# Patient Record
Sex: Male | Born: 1990
Health system: Southern US, Community
[De-identification: ages and names within clinical notes are randomized; demographics above are authoritative.]

## PROBLEM LIST (undated history)

## (undated) HISTORY — PX: HAND SURGERY: SHX662

---
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
## 2010-11-10 ENCOUNTER — Ambulatory Visit (INDEPENDENT_AMBULATORY_CARE_PROVIDER_SITE_OTHER): Payer: Self-pay

## 2010-11-10 ENCOUNTER — Emergency Department (HOSPITAL_COMMUNITY)
Admission: EM | Admit: 2010-11-10 | Discharge: 2010-11-11 | Disposition: A | Discharge status: Home / Self Care | Payer: Self-pay | Attending: Emergency Medicine | Admitting: Emergency Medicine

## 2010-11-10 ENCOUNTER — Inpatient Hospital Stay (INDEPENDENT_AMBULATORY_CARE_PROVIDER_SITE_OTHER)
Admission: RE | Admit: 2010-11-10 | Discharge: 2010-11-10 | Disposition: A | Discharge status: Home / Self Care | Payer: Self-pay | Source: Ambulatory Visit | Attending: Emergency Medicine | Admitting: Emergency Medicine

## 2010-11-10 DIAGNOSIS — R112 Nausea with vomiting, unspecified: Secondary | ICD-10-CM | POA: Insufficient documentation

## 2010-11-10 DIAGNOSIS — R109 Unspecified abdominal pain: Secondary | ICD-10-CM

## 2010-11-10 DIAGNOSIS — M545 Low back pain, unspecified: Secondary | ICD-10-CM | POA: Insufficient documentation

## 2010-11-10 DIAGNOSIS — R197 Diarrhea, unspecified: Secondary | ICD-10-CM | POA: Insufficient documentation

## 2010-11-10 LAB — POCT URINALYSIS DIP (DEVICE)
Glucose, UA: NEGATIVE mg/dL
Nitrite: POSITIVE — AB
Urobilinogen, UA: 1 mg/dL (ref 0.0–1.0)
pH: 6 (ref 5.0–8.0)

## 2010-11-10 LAB — CBC: Platelets: 266 10*3/uL (ref 150–400)

## 2010-11-10 LAB — URINALYSIS, ROUTINE W REFLEX MICROSCOPIC
Ketones, ur: >80 mg/dL — AB
Nitrite: NEGATIVE
Protein, ur: NEGATIVE mg/dL
Urobilinogen, UA: 1 mg/dL (ref 0.0–1.0)

## 2010-11-10 LAB — DIFFERENTIAL
Basophils Absolute: 0 10*3/uL (ref 0.0–0.1)
Basophils Relative: 0 % (ref 0–1)
Eosinophils Absolute: 0.3 10*3/uL (ref 0.0–0.7)
Neutrophils Relative %: 76 % (ref 43–77)

## 2010-11-11 ENCOUNTER — Emergency Department (HOSPITAL_COMMUNITY): Payer: Self-pay

## 2010-11-11 LAB — COMPREHENSIVE METABOLIC PANEL
Albumin: 3.6 g/dL (ref 3.5–5.2)
BUN: 6 mg/dL (ref 6–23)
Chloride: 105 mEq/L (ref 96–112)
Creatinine, Ser: 0.84 mg/dL (ref 0.4–1.5)
Total Bilirubin: 0.9 mg/dL (ref 0.3–1.2)
Total Protein: 6.6 g/dL (ref 6.0–8.3)

## 2010-11-11 MED ORDER — IOHEXOL 300 MG/ML  SOLN
100.0000 mL | Freq: Once | INTRAMUSCULAR | Status: AC | PRN
  Administered 2010-11-11: 100 mL via INTRAVENOUS

## 2011-02-25 ENCOUNTER — Inpatient Hospital Stay (INDEPENDENT_AMBULATORY_CARE_PROVIDER_SITE_OTHER)
Admission: RE | Admit: 2011-02-25 | Discharge: 2011-02-25 | Disposition: A | Discharge status: Home / Self Care | Payer: Self-pay | Source: Ambulatory Visit | Attending: Family Medicine | Admitting: Family Medicine

## 2011-02-25 DIAGNOSIS — S40019A Contusion of unspecified shoulder, initial encounter: Secondary | ICD-10-CM

## 2011-02-25 DIAGNOSIS — M799 Soft tissue disorder, unspecified: Secondary | ICD-10-CM

## 2011-03-02 LAB — DIFFERENTIAL
Eosinophils Relative: 4
Lymphocytes Relative: 43
Lymphs Abs: 4.7

## 2011-03-02 LAB — POCT INFECTIOUS MONO SCREEN: Mono Screen: POSITIVE — AB

## 2011-03-02 LAB — CBC
HCT: 45.3
Platelets: 326
WBC: 10.9

## 2015-09-23 ENCOUNTER — Encounter (HOSPITAL_COMMUNITY): Payer: Self-pay

## 2015-09-23 ENCOUNTER — Ambulatory Visit (HOSPITAL_COMMUNITY)
Admission: EM | Admit: 2015-09-23 | Discharge: 2015-09-23 | Disposition: A | Discharge status: Nursing Home (Includes ICF) | Payer: Self-pay | Attending: Family Medicine | Admitting: Family Medicine

## 2015-09-23 ENCOUNTER — Encounter (HOSPITAL_COMMUNITY): Payer: Self-pay | Admitting: *Deleted

## 2015-09-23 DIAGNOSIS — R1032 Left lower quadrant pain: Secondary | ICD-10-CM | POA: Insufficient documentation

## 2015-09-23 DIAGNOSIS — R110 Nausea: Secondary | ICD-10-CM | POA: Insufficient documentation

## 2015-09-23 DIAGNOSIS — K529 Noninfective gastroenteritis and colitis, unspecified: Secondary | ICD-10-CM

## 2015-09-23 DIAGNOSIS — D72829 Elevated white blood cell count, unspecified: Secondary | ICD-10-CM | POA: Insufficient documentation

## 2015-09-23 DIAGNOSIS — R509 Fever, unspecified: Secondary | ICD-10-CM | POA: Insufficient documentation

## 2015-09-23 DIAGNOSIS — F17200 Nicotine dependence, unspecified, uncomplicated: Secondary | ICD-10-CM | POA: Insufficient documentation

## 2015-09-23 LAB — LIPASE, BLOOD: LIPASE: 25 U/L (ref 11–51)

## 2015-09-23 LAB — CBC
HCT: 44.1 % (ref 39.0–52.0)
HEMOGLOBIN: 16.1 g/dL (ref 13.0–17.0)
MCH: 31.5 pg (ref 26.0–34.0)
MCHC: 36.5 g/dL — ABNORMAL HIGH (ref 30.0–36.0)
MCV: 86.3 fL (ref 78.0–100.0)
Platelets: 307 10*3/uL (ref 150–400)
RBC: 5.11 MIL/uL (ref 4.22–5.81)
RDW: 12.7 % (ref 11.5–15.5)
WBC: 15.3 10*3/uL — ABNORMAL HIGH (ref 4.0–10.5)

## 2015-09-23 LAB — COMPREHENSIVE METABOLIC PANEL
ALT: 22 U/L (ref 17–63)
ANION GAP: 16 — AB (ref 5–15)
AST: 24 U/L (ref 15–41)
Albumin: 4.5 g/dL (ref 3.5–5.0)
Alkaline Phosphatase: 76 U/L (ref 38–126)
BUN: 9 mg/dL (ref 6–20)
CHLORIDE: 106 mmol/L (ref 101–111)
CO2: 21 mmol/L — AB (ref 22–32)
Calcium: 9.7 mg/dL (ref 8.9–10.3)
Creatinine, Ser: 0.91 mg/dL (ref 0.61–1.24)
GFR calc non Af Amer: >60 mL/min (ref >60)
Glucose, Bld: 82 mg/dL (ref 65–99)
Potassium: 3.9 mmol/L (ref 3.5–5.1)
SODIUM: 143 mmol/L (ref 135–145)
Total Bilirubin: 1.2 mg/dL (ref 0.3–1.2)
Total Protein: 8.1 g/dL (ref 6.5–8.1)

## 2015-09-23 MED ORDER — ONDANSETRON 4 MG PO TBDP
4.0000 mg | ORAL_TABLET | Freq: Once | ORAL | Status: AC
  Administered 2015-09-23: 4 mg via ORAL

## 2015-09-23 MED ORDER — ONDANSETRON 4 MG PO TBDP
ORAL_TABLET | ORAL | Status: AC
  Filled 2015-09-23: qty 1

## 2015-09-23 NOTE — ED Notes (Addendum)
Pt here via [facility] from [facility] for abdominal pain and vomiting and diarrhea.  [facility]. Nausea continues but no active vomiting.  States he drank "a lot" yesterday.

## 2015-09-23 NOTE — ED Notes (Signed)
Unable to locate patient when called for re-assessment

## 2015-09-23 NOTE — ED Notes (Signed)
Patient presents with abdominal pain since this morning, sharp pain comes and goes accompanied by vomiting and diarrhea. No acute distress

## 2015-09-23 NOTE — ED Provider Notes (Signed)
CSN: [id number]     Arrival date & time 09/23/15  1445 History   First MD Initiated Contact with Patient 09/23/15 1720     Chief Complaint  Patient presents with  . Abdominal Pain   (Consider location/radiation/quality/duration/timing/severity/associated sxs/prior Treatment) Patient is a 25 y.o. male presenting with abdominal pain. The history is provided by the patient.  Abdominal Pain Pain location:  Epigastric Pain quality: cramping, sharp and stabbing   Pain quality comment:  Currently level 3 , at worse is a 9. Pain radiates to:  LUQ Pain severity:  Moderate Onset quality:  Sudden Duration:  12 hours Chronicity:  New Context: alcohol use   Relieved by:  None tried Worsened by:  Nothing tried Ineffective treatments:  None tried Associated symptoms: diarrhea, hematochezia, nausea and vomiting   Associated symptoms: no dysuria, no fever and no shortness of breath   Risk factors: alcohol abuse     History reviewed. No pertinent past medical history. History reviewed. No pertinent past surgical history. No family history on file. Social History  Substance Use Topics  . Smoking status: Current Every Day Smoker  . Smokeless tobacco: Never Used  . Alcohol Use: 4.2 oz/week    7 Shots of liquor per week    Review of Systems  Constitutional: Negative for fever.  HENT: Negative.   Respiratory: Negative for shortness of breath and wheezing.   Cardiovascular: Negative.   Gastrointestinal: Positive for nausea, vomiting, abdominal pain, diarrhea and hematochezia. Negative for blood in stool.  Genitourinary: Negative for dysuria.  All other systems reviewed and are negative.   Allergies  Review of patient's allergies indicates no known allergies.  Home Medications   Prior to Admission medications   Not on File   Meds Ordered and Administered this Visit   Medications  ondansetron (ZOFRAN-ODT) disintegrating tablet 4 mg (4 mg Oral Given 09/23/15 1742)    BP 145/87 mmHg   Pulse 82  Temp(Src) 99.4 F (37.4 C) (Oral)  Resp 16  SpO2 98% No data found.   Physical Exam  Constitutional: He is oriented to person, place, and time. He appears well-developed and well-nourished. He appears distressed.  HENT:  Mouth/Throat: Oropharynx is clear and moist.  Neck: Normal range of motion. Neck supple.  Cardiovascular: Normal heart sounds and intact distal pulses.   Pulmonary/Chest: Effort normal and breath sounds normal.  Abdominal: Soft. He exhibits no distension and no mass. Bowel sounds are decreased. There is tenderness in the epigastric area. There is no rigidity, no rebound, no guarding, no CVA tenderness, no tenderness at McBurney's point and negative Murphy's sign.  Lymphadenopathy:    He has no cervical adenopathy.  Neurological: He is alert and oriented to person, place, and time.  Skin: Skin is warm and dry.  Nursing note and vitals reviewed.   ED Course  Procedures (including critical care time)  Labs Review Labs Reviewed - No data to display  Imaging Review No results found.   Visual Acuity Review  Right Eye Distance:   Left Eye Distance:   Bilateral Distance:    Right Eye Near:   Left Eye Near:    Bilateral Near:         MDM   1. Acute gastroenteritis    Pt with h/o gastritis currently with n/v/d onset today, unable to tolerate po., possible related to etoh xs. ivf started and po zofran.   Linna HoffJames D Roselinda Bahena, MD 09/23/15 [phone]

## 2015-09-24 ENCOUNTER — Emergency Department (HOSPITAL_COMMUNITY)
Admission: EM | Admit: 2015-09-24 | Discharge: 2015-09-24 | Disposition: A | Discharge status: Home / Self Care | Payer: Self-pay | Attending: Emergency Medicine | Admitting: Emergency Medicine

## 2015-09-24 ENCOUNTER — Encounter (HOSPITAL_COMMUNITY): Payer: Self-pay | Admitting: Radiology

## 2015-09-24 ENCOUNTER — Emergency Department (HOSPITAL_COMMUNITY): Payer: Self-pay

## 2015-09-24 DIAGNOSIS — R109 Unspecified abdominal pain: Secondary | ICD-10-CM

## 2015-09-24 LAB — URINALYSIS, ROUTINE W REFLEX MICROSCOPIC
Glucose, UA: NEGATIVE mg/dL
Hgb urine dipstick: NEGATIVE
Ketones, ur: >80 mg/dL — AB
LEUKOCYTES UA: NEGATIVE
NITRITE: NEGATIVE
PROTEIN: NEGATIVE mg/dL
SPECIFIC GRAVITY, URINE: 1.015 (ref 1.005–1.030)
pH: 6 (ref 5.0–8.0)

## 2015-09-24 MED ORDER — IOPAMIDOL (ISOVUE-300) INJECTION 61%
INTRAVENOUS | Status: AC
  Administered 2015-09-24: 100 mL
  Filled 2015-09-24: qty 100

## 2015-09-24 MED ORDER — OXYCODONE-ACETAMINOPHEN 5-325 MG PO TABS
1.0000 | ORAL_TABLET | Freq: Once | ORAL | Status: AC
  Administered 2015-09-24: 1 via ORAL
  Filled 2015-09-24: qty 1

## 2015-09-24 MED ORDER — ONDANSETRON HCL 4 MG/2ML IJ SOLN
4.0000 mg | Freq: Once | INTRAMUSCULAR | Status: AC
  Administered 2015-09-24: 4 mg via INTRAVENOUS
  Filled 2015-09-24: qty 2

## 2015-09-24 MED ORDER — PROMETHAZINE HCL 25 MG RE SUPP: 25.0000 mg | Freq: Four times a day (QID) | RECTAL | Status: DC | PRN

## 2015-09-24 MED ORDER — IBUPROFEN 600 MG PO TABS: 600.0000 mg | ORAL_TABLET | Freq: Three times a day (TID) | ORAL | Status: DC | PRN

## 2015-09-24 MED ORDER — SODIUM CHLORIDE 0.9 % IV SOLN
1000.0000 mL | INTRAVENOUS | Status: DC
  Administered 2015-09-24: 1000 mL via INTRAVENOUS

## 2015-09-24 MED ORDER — SODIUM CHLORIDE 0.9 % IV SOLN
1000.0000 mL | Freq: Once | INTRAVENOUS | Status: AC
  Administered 2015-09-24: 1000 mL via INTRAVENOUS

## 2015-09-24 MED ORDER — HYDROMORPHONE HCL 1 MG/ML IJ SOLN
1.0000 mg | Freq: Once | INTRAMUSCULAR | Status: AC
  Administered 2015-09-24: 1 mg via INTRAVENOUS
  Filled 2015-09-24: qty 1

## 2015-09-24 NOTE — Discharge Instructions (Signed)

## 2015-09-24 NOTE — ED Provider Notes (Signed)
CSN: [id number]     Arrival date & time 09/23/15  1837 History  By signing my name below, I, David Gould, attest that this documentation has been prepared under the direction and in the presence of Jola Schmidt, MD. Electronically Signed: Helane Gould, ED Scribe. 09/24/2015. 2:34 AM.    Chief Complaint  Patient presents with  . Abdominal Pain   HPI HPI Comments: David Gould is a 25 y.o. male brought in by ambulance who presents to the Emergency Department complaining of worsening, waxing and waning, bilateral lower abdominal pain of sudden onset this morning. Pt states the pain began in his LLQ and has gradually worsened, radiating to the RLQ during the course of the day. He reports associated nausea with each increase in pain, as well as subjective fever, and chills. He denies any other medical issues. Pt denies any urinary symptoms, including dysuria and difficulty urinating.   History reviewed. No pertinent past medical history. History reviewed. No pertinent past surgical history. No family history on file. Social History  Substance Use Topics  . Smoking status: Current Every Day Smoker  . Smokeless tobacco: Never Used  . Alcohol Use: 4.2 oz/week    7 Shots of liquor per week    Review of Systems A complete 10 system review of systems was obtained and all systems are negative except as noted in the HPI and PMH.   Allergies  Review of patient's allergies indicates no known allergies.  Home Medications   Prior to Admission medications   Not on File   BP 145/88 mmHg  Pulse 95  Temp(Src) 99.4 F (37.4 C) (Oral)  Resp 18  Ht 5' 9"  (1.753 m)  Wt 135 lb (61.236 kg)  BMI 19.93 kg/m2  SpO2 99% Physical Exam  Constitutional: He is oriented to person, place, and time. He appears well-developed and well-nourished.  HENT:  Head: Normocephalic.  Eyes: EOM are normal.  Neck: Normal range of motion.  Pulmonary/Chest: Effort normal.  Abdominal: He exhibits no distension.  There is tenderness (mild LLQ).  Musculoskeletal: Normal range of motion.  Neurological: He is alert and oriented to person, place, and time.  Psychiatric: He has a normal mood and affect.  Nursing note and vitals reviewed.   ED Course  Procedures  DIAGNOSTIC STUDIES:  COORDINATION OF CARE: 12:23 AM - Discussed plans to order diagnostic studies and imaging. Pt advised of plan for treatment and pt agrees.  Labs Review Labs Reviewed  COMPREHENSIVE METABOLIC PANEL - Abnormal; Notable for the following:    CO2 21 (*)    Anion gap 16 (*)    All other components within normal limits  CBC - Abnormal; Notable for the following:    WBC 15.3 (*)    MCHC 36.5 (*)    All other components within normal limits  LIPASE, BLOOD  URINALYSIS, ROUTINE W REFLEX MICROSCOPIC (NOT AT [facility])    Imaging Review Ct Abdomen Pelvis W Contrast  09/24/2015  CLINICAL DATA:  Abdominal pain since this morning with vomiting and diarrhea. EXAM: CT ABDOMEN AND PELVIS WITH CONTRAST TECHNIQUE: Multidetector CT imaging of the abdomen and pelvis was performed using the standard protocol following bolus administration of intravenous contrast. CONTRAST:  163m ISOVUE-300 IOPAMIDOL (ISOVUE-300) INJECTION 61% COMPARISON:  11/11/2010 FINDINGS: Lung bases are clear. Mild diffuse fatty infiltration of the liver. No focal liver lesions. The gallbladder, pancreas, spleen, adrenal glands, kidneys, abdominal aorta, inferior vena cava, and retroperitoneal lymph nodes are unremarkable. Stomach, small bowel, and colon are not abnormally  distended. No free air or free fluid in the abdomen. Pelvis: Prostate gland is not enlarged. Bladder wall is not thickened. No free or loculated pelvic fluid collections. No pelvic mass or lymphadenopathy. Appendix is normal. No destructive bone lesions. IMPRESSION: No acute process demonstrated in the abdomen or pelvis. Mild diffuse fatty infiltration of the liver. Electronically Signed   By: Lucienne Capers  M.D.   On: 09/24/2015 01:54   I have personally reviewed and evaluated these images and lab results as part of my medical decision-making.   EKG Interpretation None      MDM   Final diagnoses:  Abdominal pain, unspecified abdominal location   possibly developing a viral infection.  Oral temperature 99.4.  Mild elevated white blood cell count.  CT scan of his abdomen and pelvis without abnormality.  Discharge home in good condition.  Home with nausea medication and anti-inflammatories.  He understands to return to the ER for new or worsening symptoms.  Repeat abdominal exam benign.  I personally performed the services described in this documentation, which was scribed in my presence. The recorded information has been reviewed and is accurate.       Jola Schmidt, MD 09/24/15 [phone]

## 2015-11-05 ENCOUNTER — Encounter (HOSPITAL_COMMUNITY): Payer: Self-pay | Admitting: Emergency Medicine

## 2015-11-05 DIAGNOSIS — J020 Streptococcal pharyngitis: Secondary | ICD-10-CM | POA: Insufficient documentation

## 2015-11-05 DIAGNOSIS — F17200 Nicotine dependence, unspecified, uncomplicated: Secondary | ICD-10-CM | POA: Insufficient documentation

## 2015-11-05 LAB — URINALYSIS, ROUTINE W REFLEX MICROSCOPIC
Bilirubin Urine: NEGATIVE
Glucose, UA: NEGATIVE mg/dL
HGB URINE DIPSTICK: NEGATIVE
Ketones, ur: NEGATIVE mg/dL
LEUKOCYTES UA: NEGATIVE
NITRITE: NEGATIVE
PROTEIN: NEGATIVE mg/dL
Specific Gravity, Urine: 1.018 (ref 1.005–1.030)
pH: 7 (ref 5.0–8.0)

## 2015-11-05 LAB — CBC WITH DIFFERENTIAL/PLATELET
Basophils Absolute: 0 10*3/uL (ref 0.0–0.1)
Basophils Relative: 0 %
EOS ABS: 0.1 10*3/uL (ref 0.0–0.7)
EOS PCT: 0 %
HCT: 45 % (ref 39.0–52.0)
Hemoglobin: 16.2 g/dL (ref 13.0–17.0)
LYMPHS ABS: 1.7 10*3/uL (ref 0.7–4.0)
Lymphocytes Relative: 11 %
MCH: 31.5 pg (ref 26.0–34.0)
MCHC: 36 g/dL (ref 30.0–36.0)
MCV: 87.5 fL (ref 78.0–100.0)
MONO ABS: 1.6 10*3/uL — AB (ref 0.1–1.0)
Monocytes Relative: 10 %
Neutro Abs: 11.8 10*3/uL — ABNORMAL HIGH (ref 1.7–7.7)
Neutrophils Relative %: 79 %
PLATELETS: 235 10*3/uL (ref 150–400)
RBC: 5.14 MIL/uL (ref 4.22–5.81)
RDW: 13.4 % (ref 11.5–15.5)
WBC: 15.1 10*3/uL — AB (ref 4.0–10.5)

## 2015-11-05 MED ORDER — ACETAMINOPHEN 325 MG PO TABS
650.0000 mg | ORAL_TABLET | Freq: Once | ORAL | Status: AC
  Administered 2015-11-05: 650 mg via ORAL

## 2015-11-05 MED ORDER — ACETAMINOPHEN 325 MG PO TABS
ORAL_TABLET | ORAL | Status: AC
  Filled 2015-11-05: qty 2

## 2015-11-05 NOTE — ED Notes (Signed)
Pt c/o generalized body aches, sore throat and elevated temp. Onset this am.  Also c/o nausea.

## 2015-11-06 ENCOUNTER — Emergency Department (HOSPITAL_COMMUNITY)
Admission: EM | Admit: 2015-11-06 | Discharge: 2015-11-06 | Disposition: A | Discharge status: Home / Self Care | Payer: Self-pay | Attending: Emergency Medicine | Admitting: Emergency Medicine

## 2015-11-06 DIAGNOSIS — J020 Streptococcal pharyngitis: Secondary | ICD-10-CM

## 2015-11-06 LAB — COMPREHENSIVE METABOLIC PANEL
ALBUMIN: 4 g/dL (ref 3.5–5.0)
ALT: 20 U/L (ref 17–63)
ANION GAP: 7 (ref 5–15)
AST: 21 U/L (ref 15–41)
Alkaline Phosphatase: 67 U/L (ref 38–126)
CHLORIDE: 106 mmol/L (ref 101–111)
CO2: 27 mmol/L (ref 22–32)
Calcium: 9.5 mg/dL (ref 8.9–10.3)
Creatinine, Ser: 0.95 mg/dL (ref 0.61–1.24)
GFR calc Af Amer: >60 mL/min (ref >60)
GLUCOSE: 87 mg/dL (ref 65–99)
POTASSIUM: 4 mmol/L (ref 3.5–5.1)
Sodium: 140 mmol/L (ref 135–145)
Total Bilirubin: 0.8 mg/dL (ref 0.3–1.2)
Total Protein: 7.6 g/dL (ref 6.5–8.1)

## 2015-11-06 LAB — RAPID STREP SCREEN (MED CTR MEBANE ONLY): STREPTOCOCCUS, GROUP A SCREEN (DIRECT): POSITIVE — AB

## 2015-11-06 MED ORDER — ONDANSETRON 4 MG PO TBDP
8.0000 mg | ORAL_TABLET | Freq: Once | ORAL | Status: AC
  Administered 2015-11-06: 8 mg via ORAL
  Filled 2015-11-06: qty 2

## 2015-11-06 MED ORDER — HYDROCODONE-ACETAMINOPHEN 7.5-325 MG/15ML PO SOLN: 15.0000 mL | Freq: Three times a day (TID) | ORAL | Status: DC | PRN

## 2015-11-06 MED ORDER — HYDROCODONE-ACETAMINOPHEN 7.5-325 MG/15ML PO SOLN
10.0000 mL | Freq: Once | ORAL | Status: AC
  Administered 2015-11-06: 10 mL via ORAL
  Filled 2015-11-06: qty 15

## 2015-11-06 MED ORDER — PENICILLIN G BENZATHINE 1200000 UNIT/2ML IM SUSP
1.2000 10*6.[IU] | Freq: Once | INTRAMUSCULAR | Status: AC
  Administered 2015-11-06: 1.2 10*6.[IU] via INTRAMUSCULAR
  Filled 2015-11-06: qty 2

## 2015-11-06 MED ORDER — IBUPROFEN 100 MG/5ML PO SUSP: 600.0000 mg | Freq: Four times a day (QID) | ORAL | Status: DC | PRN

## 2015-11-06 MED ORDER — KETOROLAC TROMETHAMINE 60 MG/2ML IM SOLN
60.0000 mg | Freq: Once | INTRAMUSCULAR | Status: AC
  Administered 2015-11-06: 60 mg via INTRAMUSCULAR
  Filled 2015-11-06: qty 2

## 2015-11-06 MED ORDER — DEXAMETHASONE SODIUM PHOSPHATE 10 MG/ML IJ SOLN
10.0000 mg | Freq: Once | INTRAMUSCULAR | Status: AC
  Administered 2015-11-06: 10 mg via INTRAMUSCULAR
  Filled 2015-11-06: qty 1

## 2015-11-06 NOTE — ED Notes (Signed)
Pt given cup of water per fluid challenge. Will re-assess shortly.

## 2015-11-06 NOTE — ED Provider Notes (Signed)
CSN: [id number]     Arrival date & time 11/05/15  2229 History   First MD Initiated Contact with Patient 11/06/15 0056     Chief Complaint  Patient presents with  . Generalized Body Aches     (Consider location/radiation/quality/duration/timing/severity/associated sxs/prior Treatment) HPI Comments: [age] year old male with no significant past medical history presents to the emergency department for evaluation of sore throat which began yesterday morning. Symptoms have been constant and worsening since onset. He denies taking any medications prior to arrival. He was able to tolerate Tylenol given in triage. Patient notes associated generalized body aches as well as nausea and nasal congestion. He has had a mild, dry cough. He denies sick contacts and states that he has no children. He believes that he has a history of mononucleosis during high school. Patient has had no inability to swallow or drooling, vomiting, or diarrhea. Patient is a daily smoker.  The history is provided by the patient and a parent. No language interpreter was used.    History reviewed. No pertinent past medical history. History reviewed. No pertinent past surgical history. History reviewed. No pertinent family history. Social History  Substance Use Topics  . Smoking status: Current Every Day Smoker  . Smokeless tobacco: Never Used  . Alcohol Use: 4.2 oz/week    7 Shots of liquor per week    Review of Systems  Constitutional: Positive for fever.  HENT: Positive for congestion and sore throat. Negative for drooling.   Respiratory: Negative for choking.   Gastrointestinal: Positive for nausea.  Musculoskeletal: Positive for myalgias (generalized body aches).  All other systems reviewed and are negative.   Allergies  Review of patient's allergies indicates no known allergies.  Home Medications   Prior to Admission medications   Medication Sig Start Date End Date Taking? Authorizing Provider   HYDROcodone-acetaminophen (HYCET) 7.5-325 mg/15 ml solution Take 15 mLs by mouth every 8 (eight) hours as needed for moderate pain or severe pain. 11/06/15   Antony MaduraKelly Dorrien Grunder, PA-C  ibuprofen (CHILDRENS IBUPROFEN) 100 MG/5ML suspension Take 30 mLs (600 mg total) by mouth every 6 (six) hours as needed for fever, mild pain or moderate pain. 11/06/15   Antony MaduraKelly Dunbar Buras, PA-C  promethazine (PHENERGAN) 25 MG suppository Place 1 suppository (25 mg total) rectally every 6 (six) hours as needed for nausea or vomiting. 09/24/15   Azalia BilisKevin Campos, MD   BP 137/88 mmHg  Pulse 96  Temp(Src) 98.3 F (36.8 C) (Oral)  Resp 18  Ht 5\' 10"  (1.778 m)  Wt 63.504 kg  BMI 20.09 kg/m2  SpO2 98%   Physical Exam  Constitutional: He is oriented to person, place, and time. He appears well-developed and well-nourished. No distress.  Nontoxic appearing  HENT:  Head: Normocephalic and atraumatic.  Right Ear: External ear normal.  Left Ear: External ear normal.  Mouth/Throat: Uvula is midline and mucous membranes are normal. Posterior oropharyngeal erythema present. No oropharyngeal exudate.  Patient tolerating secretions without difficulty. Note trismus. No tripoding. Uvula midline. No posterior oropharyngeal exudates noted.  Eyes: Conjunctivae and EOM are normal. No scleral icterus.  Neck: Normal range of motion.  No nuchal rigidity or meningismus  Cardiovascular: Normal rate, regular rhythm and intact distal pulses.   Pulmonary/Chest: Effort normal and breath sounds normal. No respiratory distress. He has no wheezes. He has no rales.  Respirations even and unlabored  Musculoskeletal: Normal range of motion.  Neurological: He is alert and oriented to person, place, and time.  Skin: Skin is warm and dry.  No rash noted. He is not diaphoretic. No erythema. No pallor.  Psychiatric: He has a normal mood and affect. His behavior is normal.  Nursing note and vitals reviewed.   ED Course  Procedures (including critical care  time) Labs Review Labs Reviewed  RAPID STREP SCREEN (NOT AT [facility]) - Abnormal; Notable for the following:    Streptococcus, Group A Screen (Direct) POSITIVE (*)    All other components within normal limits  CBC WITH DIFFERENTIAL/PLATELET - Abnormal; Notable for the following:    WBC 15.1 (*)    Neutro Abs 11.8 (*)    Monocytes Absolute 1.6 (*)    All other components within normal limits  COMPREHENSIVE METABOLIC PANEL - Abnormal; Notable for the following:    BUN <5 (*)    All other components within normal limits  URINALYSIS, ROUTINE W REFLEX MICROSCOPIC (NOT AT [facility])    Imaging Review No results found.   I have personally reviewed and evaluated these images and lab results as part of my medical decision-making.   EKG Interpretation None       Medications  penicillin g benzathine (BICILLIN LA) 1200000 UNIT/2ML injection 1.2 Million Units (not administered)  acetaminophen (TYLENOL) tablet 650 mg (650 mg Oral Given 11/05/15 2323)  dexamethasone (DECADRON) injection 10 mg (10 mg Intramuscular Given 11/06/15 0134)  ketorolac (TORADOL) injection 60 mg (60 mg Intramuscular Given 11/06/15 0134)  HYDROcodone-acetaminophen (HYCET) 7.5-325 mg/15 ml solution 10 mL (10 mLs Oral Given 11/06/15 0131)  ondansetron (ZOFRAN-ODT) disintegrating tablet 8 mg (8 mg Oral Given 11/06/15 0133)    MDM   Final diagnoses:  Strep throat    Pt presents for febrile with cervical lymphadenopathy and dysphagia x 1 day; diagnosis of strep throat. Treated in the ED with steroids, NSAIDs, pain medication and PCN IM. Presentation not concerning for PTA or infxn spread to soft tissue. No trismus or uvula deviation. Specific return precautions discussed. Patient tolerating secretions and oral fluids without difficulty or drooling. Recommended PCP follow up. Return precautions discussed and provided. Patient discharged in good condition with no unaddressed concerns.      Antony Madura, PA-C 11/06/15  1914  Dione Booze, MD 11/06/15 [phone]

## 2015-11-06 NOTE — Discharge Instructions (Signed)
Take ibuprofen as prescribed for fever and body aches. Take Hycet for throat pain. Do not take Tylenol or acetaminophen if taking Hycet. You can gargle salt water 3-4 times per day. Drink plenty of fluids to prevent dehydration. Return if symptoms worsen.  Strep Throat Strep throat is a bacterial infection of the throat. Your health care provider may call the infection tonsillitis or pharyngitis, depending on whether there is swelling in the tonsils or at the back of the throat. Strep throat is most common during the cold months of the year in children who are 10-19 years of age, but it can happen during any season in people of any age. This infection is spread from person to person (contagious) through coughing, sneezing, or close contact. CAUSES Strep throat is caused by the bacteria called Streptococcus pyogenes. RISK FACTORS This condition is more likely to develop in:  People who spend time in crowded places where the infection can spread easily.  People who have close contact with someone who has strep throat. SYMPTOMS Symptoms of this condition include:  Fever or chills.   Redness, swelling, or pain in the tonsils or throat.  Pain or difficulty when swallowing.  White or yellow spots on the tonsils or throat.  Swollen, tender glands in the neck or under the jaw.  Red rash all over the body (rare). DIAGNOSIS This condition is diagnosed by performing a rapid strep test or by taking a swab of your throat (throat culture test). Results from a rapid strep test are usually ready in a few minutes, but throat culture test results are available after one or two days. TREATMENT This condition is treated with antibiotic medicine. HOME CARE INSTRUCTIONS Medicines  Take over-the-counter and prescription medicines only as told by your health care provider.  Take your antibiotic as told by your health care provider. Do not stop taking the antibiotic even if you start to feel  better.  Have family members who also have a sore throat or fever tested for strep throat. They may need antibiotics if they have the strep infection. Eating and Drinking  Do not share food, drinking cups, or personal items that could cause the infection to spread to other people.  If swallowing is difficult, try eating soft foods until your sore throat feels better.  Drink enough fluid to keep your urine clear or pale yellow. General Instructions  Gargle with a salt-water mixture 3-4 times per day or as needed. To make a salt-water mixture, completely dissolve -1 tsp of salt in 1 cup of warm water.  Make sure that all household members wash their hands well.  Get plenty of rest.  Stay home from school or work until you have been taking antibiotics for 24 hours.  Keep all follow-up visits as told by your health care provider. This is important. SEEK MEDICAL CARE IF:  The glands in your neck continue to get bigger.  You develop a rash, cough, or earache.  You cough up a thick liquid that is green, yellow-brown, or bloody.  You have pain or discomfort that does not get better with medicine.  Your problems seem to be getting worse rather than better.  You have a fever. SEEK IMMEDIATE MEDICAL CARE IF:  You have new symptoms, such as vomiting, severe headache, stiff or painful neck, chest pain, or shortness of breath.  You have severe throat pain, drooling, or changes in your voice.  You have swelling of the neck, or the skin on the neck becomes  red and tender.  You have signs of dehydration, such as fatigue, dry mouth, and decreased urination.  You become increasingly sleepy, or you cannot wake up completely.  Your joints become red or painful.   This information is not intended to replace advice given to you by your health care provider. Make sure you discuss any questions you have with your health care provider.   Document Released: 05/22/2000 Document Revised:  02/13/2015 Document Reviewed: 09/17/2014 Elsevier Interactive Patient Education [employer]2016 [employer] Inc[employer]

## 2016-01-26 ENCOUNTER — Emergency Department (HOSPITAL_COMMUNITY)
Admission: EM | Admit: 2016-01-26 | Discharge: 2016-01-26 | Disposition: A | Discharge status: Home / Self Care | Payer: Self-pay | Attending: Emergency Medicine | Admitting: Emergency Medicine

## 2016-01-26 ENCOUNTER — Emergency Department (HOSPITAL_COMMUNITY): Payer: Self-pay

## 2016-01-26 ENCOUNTER — Encounter (HOSPITAL_COMMUNITY): Payer: Self-pay

## 2016-01-26 DIAGNOSIS — F17200 Nicotine dependence, unspecified, uncomplicated: Secondary | ICD-10-CM | POA: Insufficient documentation

## 2016-01-26 DIAGNOSIS — B349 Viral infection, unspecified: Secondary | ICD-10-CM | POA: Insufficient documentation

## 2016-01-26 LAB — RAPID STREP SCREEN (MED CTR MEBANE ONLY): Streptococcus, Group A Screen (Direct): NEGATIVE

## 2016-01-26 MED ORDER — ACETAMINOPHEN 325 MG PO TABS
650.0000 mg | ORAL_TABLET | Freq: Once | ORAL | Status: AC | PRN
  Administered 2016-01-26: 650 mg via ORAL

## 2016-01-26 MED ORDER — ACETAMINOPHEN 325 MG PO TABS
ORAL_TABLET | ORAL | Status: AC
  Filled 2016-01-26: qty 2

## 2016-01-26 NOTE — ED Triage Notes (Signed)
Pt states flu-like symptoms. Complaining of body aches and fevers. Pt states also has sore throat since yesterday. Pt also states congested cough.

## 2016-01-26 NOTE — ED Provider Notes (Signed)
[facility]-EMERGENCY DEPT Provider Note   CSN: [id number] Arrival date & time: 01/26/16  1729     History   Chief Complaint Chief Complaint  Patient presents with  . Generalized Body Aches  . Cough    HPI David Gould is a 25 y.o. male.  Fever, headache, sore throat, chest pain, achiness for 24 hours. No stiff neck, neurological deficits, dysuria.  He is normally healthy. On no medications. He has been drinking fluids, but not as much as usual.      History reviewed. No pertinent past medical history.  There are no active problems to display for this patient.   History reviewed. No pertinent surgical history.     Home Medications    Prior to Admission medications   Medication Sig Start Date End Date Taking? Authorizing Provider  HYDROcodone-acetaminophen (HYCET) 7.5-325 mg/15 ml solution Take 15 mLs by mouth every 8 (eight) hours as needed for moderate pain or severe pain. 11/06/15   Antony MaduraKelly Humes, PA-C  ibuprofen (CHILDRENS IBUPROFEN) 100 MG/5ML suspension Take 30 mLs (600 mg total) by mouth every 6 (six) hours as needed for fever, mild pain or moderate pain. 11/06/15   Antony MaduraKelly Humes, PA-C  promethazine (PHENERGAN) 25 MG suppository Place 1 suppository (25 mg total) rectally every 6 (six) hours as needed for nausea or vomiting. 09/24/15   Azalia BilisKevin Campos, MD    Family History History reviewed. No pertinent family history.  Social History Social History  Substance Use Topics  . Smoking status: Current Every Day Smoker  . Smokeless tobacco: Never Used  . Alcohol use 4.2 oz/week    7 Shots of liquor per week     Allergies   Review of patient's allergies indicates no known allergies.   Review of Systems Review of Systems  All other systems reviewed and are negative.    Physical Exam Updated Vital Signs BP 127/89   Pulse 107   Temp 99.2 F (37.3 C) (Oral)   Resp 18   Ht 5\' 9"  (1.753 m)   Wt 140 lb (63.5 kg)   SpO2 100%   BMI 20.67 kg/m   Physical Exam    Constitutional: He appears well-developed and well-nourished.  Diaphoretic, but nontoxic-appearing  HENT:  Head: Normocephalic and atraumatic.  Oropharynx is slightly erythematous  Eyes: Conjunctivae are normal.  Neck: Neck supple.  No meningeal signs.  Cardiovascular: Normal rate and regular rhythm.   No murmur heard. Pulmonary/Chest: Effort normal and breath sounds normal. No respiratory distress.  Abdominal: Soft. There is no tenderness.  Musculoskeletal: He exhibits no edema.  Neurological: He is alert.  Skin: Skin is warm and dry.  Psychiatric: He has a normal mood and affect.  Nursing note and vitals reviewed.    ED Treatments / Results  Labs (all labs ordered are listed, but only abnormal results are displayed) Labs Reviewed  R[facility]ID [facility]EN [facility])  CULTURE, GROUP A STREP [facility])    EKG  EKG Interpretation None       Radiology Dg Chest 2 View  Result Date: 01/26/2016 CLINICAL DATA:  Fever and productive cough EXAM: CHEST  2 VIEW COMPARISON:  None. FINDINGS: The heart size and mediastinal contours are within normal limits. Both lungs are clear. The visualized skeletal structures are unremarkable. IMPRESSION: No active cardiopulmonary disease. Electronically Signed   By: Alcide CleverMark  Lukens M.D.   On: 01/26/2016 18:39    Procedures Procedures (including critical care time)  Medications Ordered in ED Medications  acetaminophen (TYLENOL) tablet 650 mg (  650 mg Oral Given 01/26/16 1742)     Initial Impression / Assessment and Plan / ED Course  I have reviewed the triage vital signs and the nursing notes.  Pertinent labs & imaging results that were available during my care of the patient were reviewed by me and considered in my medical decision making (see chart for details).  Clinical Course    Patient is nontoxic-appearing. Chest x-ray and rapid strep were negative. Encouraged to drink fluids and take Tylenol or ibuprofen  Final Clinical Impressions(s)  / ED Diagnoses   Final diagnoses:  Viral illness    New Prescriptions New Prescriptions   No medications on file     Donnetta HutchingBrian Teniqua Marron, MD 01/26/16 2032

## 2016-01-26 NOTE — Discharge Instructions (Signed)
Increase fluids. Gargle with salt water. Tylenol and/or ibuprofen for fever and pain. Return if worse.

## 2016-01-29 LAB — CULTURE, GROUP A STREP (THRC)

## 2016-08-31 ENCOUNTER — Emergency Department (HOSPITAL_COMMUNITY): Payer: Self-pay

## 2016-08-31 ENCOUNTER — Encounter (HOSPITAL_COMMUNITY): Payer: Self-pay

## 2016-08-31 ENCOUNTER — Emergency Department (HOSPITAL_COMMUNITY)
Admission: EM | Admit: 2016-08-31 | Discharge: 2016-08-31 | Disposition: A | Discharge status: Home / Self Care | Payer: Self-pay | Attending: Emergency Medicine | Admitting: Emergency Medicine

## 2016-08-31 DIAGNOSIS — F17200 Nicotine dependence, unspecified, uncomplicated: Secondary | ICD-10-CM | POA: Insufficient documentation

## 2016-08-31 DIAGNOSIS — Y939 Activity, unspecified: Secondary | ICD-10-CM | POA: Insufficient documentation

## 2016-08-31 DIAGNOSIS — Z23 Encounter for immunization: Secondary | ICD-10-CM | POA: Insufficient documentation

## 2016-08-31 DIAGNOSIS — S8990XA Unspecified injury of unspecified lower leg, initial encounter: Secondary | ICD-10-CM

## 2016-08-31 DIAGNOSIS — Y92410 Unspecified street and highway as the place of occurrence of the external cause: Secondary | ICD-10-CM | POA: Insufficient documentation

## 2016-08-31 DIAGNOSIS — S8012XA Contusion of left lower leg, initial encounter: Secondary | ICD-10-CM | POA: Insufficient documentation

## 2016-08-31 DIAGNOSIS — Y999 Unspecified external cause status: Secondary | ICD-10-CM | POA: Insufficient documentation

## 2016-08-31 MED ORDER — IBUPROFEN 600 MG PO TABS: 600.0000 mg | ORAL_TABLET | Freq: Four times a day (QID) | ORAL | 0 refills | Status: DC | PRN

## 2016-08-31 MED ORDER — TETANUS-DIPHTH-ACELL PERTUSSIS 5-2.5-18.5 LF-MCG/0.5 IM SUSP
0.5000 mL | Freq: Once | INTRAMUSCULAR | Status: AC
  Administered 2016-08-31: 0.5 mL via INTRAMUSCULAR
  Filled 2016-08-31: qty 0.5

## 2016-08-31 MED ORDER — BACITRACIN ZINC 500 UNIT/GM EX OINT: 1.0000 "application " | TOPICAL_OINTMENT | Freq: Two times a day (BID) | CUTANEOUS | 0 refills | Status: DC
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
## 2016-08-31 NOTE — ED Notes (Signed)
Declined W/C at D/C and was escorted to lobby by RN. 

## 2016-08-31 NOTE — Discharge Instructions (Signed)
Ice and elevate your leg. Ibuprofen for pain. Bacitracin twice a day to the wound. Follow up with family doctor as needed
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
## 2016-11-04 ENCOUNTER — Encounter (HOSPITAL_BASED_OUTPATIENT_CLINIC_OR_DEPARTMENT_OTHER): Payer: Self-pay

## 2016-11-04 ENCOUNTER — Emergency Department (HOSPITAL_BASED_OUTPATIENT_CLINIC_OR_DEPARTMENT_OTHER)
Admission: EM | Admit: 2016-11-04 | Discharge: 2016-11-04 | Disposition: A | Discharge status: Home / Self Care | Payer: Self-pay | Attending: Emergency Medicine | Admitting: Emergency Medicine

## 2016-11-04 DIAGNOSIS — R112 Nausea with vomiting, unspecified: Secondary | ICD-10-CM | POA: Insufficient documentation

## 2016-11-04 DIAGNOSIS — J029 Acute pharyngitis, unspecified: Secondary | ICD-10-CM | POA: Insufficient documentation

## 2016-11-04 DIAGNOSIS — F17210 Nicotine dependence, cigarettes, uncomplicated: Secondary | ICD-10-CM | POA: Insufficient documentation

## 2016-11-04 LAB — RAPID STREP SCREEN (MED CTR MEBANE ONLY): STREPTOCOCCUS, GROUP A SCREEN (DIRECT): NEGATIVE

## 2016-11-04 MED ORDER — ONDANSETRON 8 MG PO TBDP: ORAL_TABLET | ORAL | 0 refills | Status: DC

## 2016-11-04 MED FILL — ONDANSETRON ODT 8 MG TABLET: 8 | 2 days supply | Qty: 6 | Fill #0

## 2016-11-04 NOTE — Discharge Instructions (Signed)
Zofran as prescribed as needed for nausea.  Tylenol 1000 mg every 6 hours as needed for pain.  Return to the emergency department if your symptoms significantly worsen or change.

## 2016-11-04 NOTE — ED Provider Notes (Signed)
[facility] Provider Note   CSN: [id number] Arrival date & time: 11/04/16  1223     History   Chief Complaint Chief Complaint  Patient presents with  . Sore Throat    HPI David Gould is a 26 y.o. male.  Patient is a 26 year old male who presents with complaints of vomiting and sore throat. Vomiting started yesterday while at work and worsened this morning. He reports a small quantity of emesis this morning with red spots he felt was blood. He also reports a sore throat and that his girlfriend was ill with a sore throat as well. He denies any fevers or chills.   The history is provided by the patient.  Sore Throat  This is a new problem. The current episode started yesterday. The problem occurs constantly. The problem has been gradually worsening. Pertinent negatives include no chest pain and no abdominal pain. Nothing aggravates the symptoms. Nothing relieves the symptoms.    History reviewed. No pertinent past medical history.  There are no active problems to display for this patient.   History reviewed. No pertinent surgical history.     Home Medications    Prior to Admission medications   Not on File    Family History No family history on file.  Social History Social History  Substance Use Topics  . Smoking status: Current Every Day Smoker    Types: Cigarettes  . Smokeless tobacco: Never Used  . Alcohol use Yes     Comment: weekly     Allergies   Patient has no known allergies.   Review of Systems Review of Systems  Cardiovascular: Negative for chest pain.  Gastrointestinal: Negative for abdominal pain.  All other systems reviewed and are negative.    Physical Exam Updated Vital Signs BP 127/82 (BP Location: Left Arm)   Pulse 86   Temp 99.1 F (37.3 C) (Oral)   Resp 18   Ht 5' 9"  (1.753 m)   Wt 63.5 kg (140 lb)   SpO2 100%   BMI 20.67 kg/m   Physical Exam  Constitutional: He is oriented to person, place, and time. He  appears well-developed and well-nourished. No distress.  HENT:  Head: Normocephalic and atraumatic.  Mouth/Throat: No oropharyngeal exudate.  There is mild erythema of the posterior oropharynx  Neck: Normal range of motion. Neck supple.  Cardiovascular: Normal rate and regular rhythm.  Exam reveals no friction rub.   No murmur heard. Pulmonary/Chest: Effort normal and breath sounds normal. No respiratory distress. He has no wheezes. He has no rales.  Abdominal: Soft. Bowel sounds are normal. He exhibits no distension. There is no tenderness.  Musculoskeletal: Normal range of motion. He exhibits no edema.  Neurological: He is alert and oriented to person, place, and time. Coordination normal.  Skin: Skin is warm and dry. He is not diaphoretic.  Nursing note and vitals reviewed.    ED Treatments / Results  Labs (all labs ordered are listed, but only abnormal results are displayed) Labs Reviewed  RAPID STREP SCREEN (NOT AT [facility])    EKG  EKG Interpretation None       Radiology No results found.  Procedures Procedures (including critical care time)  Medications Ordered in ED Medications - No data to display   Initial Impression / Assessment and Plan / ED Course  I have reviewed the triage vital signs and the nursing notes.  Pertinent labs & imaging results that were available during my care of the patient were reviewed by  me and considered in my medical decision making (see chart for details).  Strep test is negative. Symptoms are most likely viral in nature. He does report a small amount of blood in his emesis that I suspect is related to trauma, likely a Mallory-Weiss tear. He will be treated with Zofran and when necessary return.  Final Clinical Impressions(s) / ED Diagnoses   Final diagnoses:  None    New Prescriptions New Prescriptions   No medications on file     Veryl Speak, MD 11/04/16 1336

## 2016-11-04 NOTE — ED Triage Notes (Signed)
C/o sore throat x 2 days-NAD-steady gait 

## 2016-11-04 NOTE — ED Notes (Signed)
Assumed care of patient from [place], [place]. Pt resting quietly at this time. Awaiting rapid strep screen results. No distress.

## 2016-11-07 LAB — CULTURE, GROUP A STREP (THRC)

## 2016-12-15 ENCOUNTER — Emergency Department (HOSPITAL_COMMUNITY)
Admission: EM | Admit: 2016-12-15 | Discharge: 2016-12-15 | Disposition: A | Discharge status: Home / Self Care | Payer: Self-pay | Attending: Emergency Medicine | Admitting: Emergency Medicine

## 2016-12-15 ENCOUNTER — Emergency Department (HOSPITAL_COMMUNITY): Payer: Self-pay

## 2016-12-15 ENCOUNTER — Encounter (HOSPITAL_COMMUNITY): Payer: Self-pay | Admitting: Vascular Surgery

## 2016-12-15 DIAGNOSIS — F17210 Nicotine dependence, cigarettes, uncomplicated: Secondary | ICD-10-CM | POA: Insufficient documentation

## 2016-12-15 DIAGNOSIS — R079 Chest pain, unspecified: Secondary | ICD-10-CM | POA: Insufficient documentation

## 2016-12-15 DIAGNOSIS — G43809 Other migraine, not intractable, without status migrainosus: Secondary | ICD-10-CM | POA: Insufficient documentation

## 2016-12-15 LAB — I-STAT TROPONIN, ED
Troponin i, poc: 0 ng/mL (ref 0.00–0.08)
Troponin i, poc: 0 ng/mL (ref 0.00–0.08)

## 2016-12-15 LAB — BASIC METABOLIC PANEL
ANION GAP: 11 (ref 5–15)
BUN: 6 mg/dL (ref 6–20)
CHLORIDE: 109 mmol/L (ref 101–111)
CO2: 21 mmol/L — AB (ref 22–32)
Calcium: 9.2 mg/dL (ref 8.9–10.3)
Creatinine, Ser: 0.82 mg/dL (ref 0.61–1.24)
GFR calc Af Amer: >60 mL/min (ref >60)
GLUCOSE: 107 mg/dL — AB (ref 65–99)
POTASSIUM: 3.2 mmol/L — AB (ref 3.5–5.1)
Sodium: 141 mmol/L (ref 135–145)

## 2016-12-15 LAB — CBC
HEMATOCRIT: 45.4 % (ref 39.0–52.0)
HEMOGLOBIN: 16.1 g/dL (ref 13.0–17.0)
MCH: 32 pg (ref 26.0–34.0)
MCHC: 35.5 g/dL (ref 30.0–36.0)
MCV: 90.3 fL (ref 78.0–100.0)
Platelets: 301 10*3/uL (ref 150–400)
RBC: 5.03 MIL/uL (ref 4.22–5.81)
RDW: 12.9 % (ref 11.5–15.5)
WBC: 7.7 10*3/uL (ref 4.0–10.5)

## 2016-12-15 LAB — D-DIMER, QUANTITATIVE: D-Dimer, Quant: <0.27 ug/mL-FEU (ref 0.00–0.50)

## 2016-12-15 MED ORDER — DIPHENHYDRAMINE HCL 50 MG/ML IJ SOLN
25.0000 mg | Freq: Once | INTRAMUSCULAR | Status: AC
  Administered 2016-12-15: 25 mg via INTRAVENOUS
  Filled 2016-12-15: qty 1

## 2016-12-15 MED ORDER — PROCHLORPERAZINE EDISYLATE 5 MG/ML IJ SOLN
10.0000 mg | Freq: Once | INTRAMUSCULAR | Status: AC
  Administered 2016-12-15: 10 mg via INTRAVENOUS
  Filled 2016-12-15: qty 2

## 2016-12-15 MED ORDER — SODIUM CHLORIDE 0.9 % IV BOLUS (SEPSIS)
1000.0000 mL | Freq: Once | INTRAVENOUS | Status: AC
  Administered 2016-12-15: 1000 mL via INTRAVENOUS

## 2016-12-15 NOTE — ED Triage Notes (Signed)
Pt reports to the ED for eval of CP and HA. Onset this am. Pt localizes the pain to the mid/substernal region of his chest. Describes it as a dull pain with intermittent sharp pain. Pt reports some associated SOB earlier as well as some dizziness. Pt denies any lightheadedness or N/V. Onset of symptoms was this am. Reports HA is worse with light and sound. Pt has hx of migraines when he gets in the sun and has been in the sun more than normal lately. Denies taking any medication for this pain.

## 2016-12-15 NOTE — ED Provider Notes (Signed)
[facility] Provider Note   CSN: [id number] Arrival date & time: 12/15/16  1525     History   Chief Complaint Chief Complaint  Patient presents with  . Chest Pain  . Headache    HPI David Gould is a 26 y.o. male.  HPI   Chest pain, feels like sharp pain that began this morning. Nothing makes it better or worse. Not exertional, not pleuritic or positional. No hx of pain like this.  Associated dyspnea and headache.  No hx of smoking, other drugs, family hx of DVT/ nor early heart disease, no recent immobilization or surgery.  Dyspnea is mild. Currently symptoms improved.   Headache feels like typical migraine. Woke up with it this morning, began slowly and worsened, worse with bright lights.    History reviewed. No pertinent past medical history.  There are no active problems to display for this patient.   History reviewed. No pertinent surgical history.     Home Medications    Prior to Admission medications   Medication Sig Start Date End Date Taking? Authorizing Provider  ondansetron (ZOFRAN ODT) 8 MG disintegrating tablet 8mg  ODT q4 hours prn nausea 11/04/16   Geoffery Lyonselo, Douglas, MD    Family History No family history on file.  Social History Social History  Substance Use Topics  . Smoking status: Current Every Day Smoker    Types: Cigarettes  . Smokeless tobacco: Never Used  . Alcohol use Yes     Comment: weekly     Allergies   Patient has no known allergies.   Review of Systems Review of Systems  Constitutional: Negative for diaphoresis and fever.  HENT: Negative for sore throat.   Eyes: Negative for visual disturbance.  Respiratory: Positive for shortness of breath. Negative for cough.   Cardiovascular: Positive for chest pain. Negative for leg swelling.  Gastrointestinal: Negative for abdominal pain, nausea and vomiting.  Genitourinary: Negative for difficulty urinating.  Musculoskeletal: Negative for back pain and neck stiffness.  Skin:  Negative for rash.  Neurological: Positive for headaches. Negative for syncope and light-headedness.     Physical Exam Updated Vital Signs BP 112/66   Pulse 83   Temp 99.2 F (37.3 C) (Oral)   Resp 18   Ht 5\' 9"  (1.753 m)   Wt 62.1 kg (137 lb)   SpO2 97%   BMI 20.23 kg/m   Physical Exam  Constitutional: He is oriented to person, place, and time. He appears well-developed and well-nourished. No distress.  HENT:  Head: Normocephalic and atraumatic.  Eyes: Conjunctivae and EOM are normal.  Neck: Normal range of motion.  Cardiovascular: Normal rate, regular rhythm, normal heart sounds and intact distal pulses.  Exam reveals no gallop and no friction rub.   No murmur heard. Pulmonary/Chest: Effort normal and breath sounds normal. No respiratory distress. He has no wheezes. He has no rales.  Abdominal: Soft. He exhibits no distension. There is no tenderness. There is no guarding.  Musculoskeletal: He exhibits no edema.  Neurological: He is alert and oriented to person, place, and time.  Skin: Skin is warm and dry. He is not diaphoretic.  Nursing note and vitals reviewed.    ED Treatments / Results  Labs (all labs ordered are listed, but only abnormal results are displayed) Labs Reviewed  BASIC METABOLIC PANEL - Abnormal; Notable for the following:       Result Value   Potassium 3.2 (*)    CO2 21 (*)    Glucose, Bld 107 (*)  All other components within normal limits  CBC  D-DIMER, QUANTITATIVE (NOT AT [facility])  I-STAT TROPOININ, ED  I-STAT TROPOININ, ED    EKG  EKG Interpretation  Date/Time:  Tuesday December 15 2016 15:37:15 EDT Ventricular Rate:  108 PR Interval:  116 QRS Duration: 94 QT Interval:  322 QTC Calculation: 431 R Axis:   91 Text Interpretation:  Sinus tachycardia Rightward axis Cannot rule out Inferior infarct , age undetermined T wave abnormality, consider lateral ischemia Abnormal ECG No previous ECGs available Confirmed by Alvira Monday ([id number]) on  12/15/2016 9:25:56 PM       Radiology Dg Chest 2 View  Result Date: 12/15/2016 CLINICAL DATA:  Chest pain, headache today, some shortness of breath and dizziness EXAM: CHEST  2 VIEW COMPARISON:  Chest x-ray of 01/26/2016 FINDINGS: No active infiltrate or effusion is seen. Mediastinal and hilar contours are unremarkable. The heart is within normal limits in size. No bony abnormality is seen. IMPRESSION: No active cardiopulmonary disease. Electronically Signed   By: Dwyane Dee M.D.   On: 12/15/2016 16:34    Procedures Procedures (including critical care time)  Medications Ordered in ED Medications  sodium chloride 0.9 % bolus 1,000 mL (0 mLs Intravenous Stopped 12/15/16 2301)  prochlorperazine (COMPAZINE) injection 10 mg (10 mg Intravenous Given 12/15/16 2138)  diphenhydrAMINE (BENADRYL) injection 25 mg (25 mg Intravenous Given 12/15/16 2138)     Initial Impression / Assessment and Plan / ED Course  I have reviewed the triage vital signs and the nursing notes.  Pertinent labs & imaging results that were available during my care of the patient were reviewed by me and considered in my medical decision making (see chart for details).     26 year old male with history of migraines presents with concern of chest pain and headache.  Differential diagnosis for chest pain includes pulmonary embolus, dissection, pneumothorax, pneumonia, ACS, myocarditis, pericarditis.  EKG was done and evaluate by me and showed some lateral and inferior TW changes with no prior ECG for comparison, and no signs of pericarditis. Chest x-ray was done and evaluated by me and radiology and showed no sign of pneumonia or pneumothorax. Patient is low risk [place] with a negative ddimer and have low suspicion for PE.  Patient is low risk HEART score and had delta troponins which were both negative. While his ECG is not normal, he has no risk factors for PE nor MI, has normal vital signs at this time, no hypoxia, no continuing  tachycardia after triage, no cardiac risk factors with atypical pain and negative troponin and doubt PE or cardiac etiology. Given this evaluation, history and physical have low suspicion for pulmonary embolus, pneumonia, ACS, myocarditis, pericarditis, dissection.   Pt also reporting headache consistent with prior migraines. Hx does not suggest SAH. HA improved with migraine cocktail.  Patient discharged in stable condition with understanding of reasons to return and recommend PCP follow up.   Final Clinical Impressions(s) / ED Diagnoses   Final diagnoses:  Nonspecific chest pain  Other migraine without status migrainosus, not intractable    New Prescriptions Discharge Medication List as of 12/15/2016 11:04 PM       Alvira Monday, MD 12/16/16 [phone]

## 2017-01-25 ENCOUNTER — Encounter (HOSPITAL_COMMUNITY): Payer: Self-pay

## 2017-01-25 ENCOUNTER — Emergency Department (HOSPITAL_COMMUNITY)
Admission: EM | Admit: 2017-01-25 | Discharge: 2017-01-25 | Disposition: A | Discharge status: Home / Self Care | Payer: Self-pay | Attending: Emergency Medicine | Admitting: Emergency Medicine

## 2017-01-25 DIAGNOSIS — F17210 Nicotine dependence, cigarettes, uncomplicated: Secondary | ICD-10-CM | POA: Insufficient documentation

## 2017-01-25 DIAGNOSIS — J0100 Acute maxillary sinusitis, unspecified: Secondary | ICD-10-CM | POA: Insufficient documentation

## 2017-01-25 MED ORDER — OXYMETAZOLINE HCL 0.05 % NA SOLN: NASAL | 0 refills | Status: AC

## 2017-01-25 MED ORDER — FLUTICASONE PROPIONATE 50 MCG/ACT NA SUSP: 2.0000 | Freq: Every day | NASAL | 0 refills | Status: DC

## 2017-01-25 NOTE — ED Notes (Addendum)
Pt c//o yellow nasal drainage, facial pain bilateral ear pain with nausea secondary to drainage over last 2 days. Walks with steady gait and NAD

## 2017-01-25 NOTE — ED Triage Notes (Signed)
Pt reports sinus congestion that began yesterday. He reports cough as well. Pt afebrile in triage. Lung sounds clear.

## 2017-01-25 NOTE — ED Notes (Signed)
PT STATES HE UNDERSTANDS INSTRUCTIONS. hOME STBLA WITH STEADY GAIT.

## 2017-01-25 NOTE — Discharge Instructions (Signed)
You may use Afrin twice daily for 48 hours, but do not use this medication for longer than that as it may cause your nasal congestion to get worse. You may also use fluticasone for your congestion for any amount of time. You may take ibuprofen and Tylenol as needed for your generalized aches and pains. You may also continue using DayQuil and Mucinex, and make sure to drink plenty of water with this medication and stay hydrated in general. Follow-up with your primary care physician for reevaluation if her symptoms persist greater than 8 days, as this is an indication that your symptoms are from a bacterial cause and not a viral one and you may need antibiotics. Return to the ED if any concerning signs or symptoms develop such as persistent shortness of breath and swelling of the lips or tongue.

## 2017-01-25 NOTE — ED Provider Notes (Signed)
[facility]-EMERGENCY DEPT Provider Note   CSN: [id number] Arrival date & time: 01/25/17  1225  By signing my name below, I, Diona Browner, attest that this documentation has been prepared under the direction and in the presence of [facility], PA-C. Electronically Signed: Diona Browner, ED Scribe. 01/25/17. 3:46 PM.  History   Chief Complaint Chief Complaint  Patient presents with  . URI    HPI David Gould is a 26 y.o. male who presents to the Emergency Department complaining of acute onset sinus congestion that started 2 days ago on 01/24/17. Associated sx include nonproductive cough, sneezing, bilateral sinus pressure and pain, nausea, bilateral ear pain, sneezing, rhinorrhea, Subjective fevers, chills, and generalized myalgias and SOB with exertion due to nasal congestion. He has tried dayquil and mucinex without relief. No known allergies to medication. Pt denies CP, vomiting, or any other complaints at this time.   The history is provided by the patient. No language interpreter was used.    History reviewed. No pertinent past medical history.  There are no active problems to display for this patient.   History reviewed. No pertinent surgical history.     Home Medications    Prior to Admission medications   Medication Sig Start Date End Date Taking? Authorizing Provider  fluticasone (FLONASE) 50 MCG/ACT nasal spray Place 2 sprays into both nostrils daily. 01/25/17   Michela Pitcher A, PA-C  ondansetron (ZOFRAN ODT) 8 MG disintegrating tablet 8mg  ODT q4 hours prn nausea 11/04/16   Geoffery Lyons, MD  oxymetazoline (AFRIN NASAL SPRAY) 0.05 % nasal spray Instill 2 to 3 sprays into each nostril twice daily for no more than 2 days (maximum dose: 2 doses/24 hours) 01/25/17   Jeanie Sewer, PA-C    Family History History reviewed. No pertinent family history.  Social History Social History  Substance Use Topics  . Smoking status: Current Every Day Smoker    Types: Cigarettes  .  Smokeless tobacco: Never Used  . Alcohol use Yes     Comment: weekly     Allergies   Patient has no known allergies.   Review of Systems Review of Systems  Constitutional: Positive for fever.  HENT: Positive for congestion, ear pain, rhinorrhea, sinus pain, sinus pressure and sneezing.   Respiratory: Positive for cough and shortness of breath.   Gastrointestinal: Positive for nausea. Negative for vomiting.  Musculoskeletal: Positive for myalgias.     Physical Exam Updated Vital Signs BP 123/68 (BP Location: Left Arm)   Pulse 94   Temp 98.3 F (36.8 C) (Oral)   Resp 18   Ht 5\' 9"  (1.753 m)   Wt 61.7 kg (136 lb)   SpO2 97%   BMI 20.08 kg/m   Physical Exam  Constitutional: He appears well-developed and well-nourished. No distress.  HENT:  Head: Normocephalic and atraumatic.  Right Ear: External ear normal.  Left Ear: External ear normal.  Mouth/Throat: Oropharynx is clear and moist.  Mild maxillary and frontal sinus TTP, worst in the left maxillary sinus. TMs normal bilaterally without erythema or bulging. Posterior oropharynx without uvular deviation, tonsillar hypertrophy, exudates, or erythema. Postnasal drip is noted. Nasal septum is midline with pale pink mucosa and nasal congestion noted. No trismus or sublingual abnormalities. No swelling of the lips or tongue, no drooling  Eyes: Conjunctivae are normal. Right eye exhibits no discharge. Left eye exhibits no discharge.  Neck: Normal range of motion. Neck supple. No JVD present. No tracheal deviation present. No thyromegaly present.  Cardiovascular: Normal rate,  regular rhythm and normal heart sounds.   Pulmonary/Chest: Effort normal and breath sounds normal. No respiratory distress. He has no wheezes. He has no rales. He exhibits no tenderness.  Abdominal: He exhibits no distension.  Musculoskeletal: He exhibits no edema.  Lymphadenopathy:    He has no cervical adenopathy.  Neurological: He is alert.  Skin: Skin  is warm and dry. No erythema.  Psychiatric: He has a normal mood and affect. His behavior is normal.  Nursing note and vitals reviewed.    ED Treatments / Results  DIAGNOSTIC STUDIES: Oxygen Saturation is 97% on RA, normal by my interpretation.   COORDINATION OF CARE: 3:46 PM-Discussed next steps with pt. Pt verbalized understanding and is agreeable with the plan.   Labs (all labs ordered are listed, but only abnormal results are displayed) Labs Reviewed - No data to display  EKG  EKG Interpretation None       Radiology No results found.  Procedures Procedures (including critical care time)  Medications Ordered in ED Medications - No data to display   Initial Impression / Assessment and Plan / ED Course  I have reviewed the triage vital signs and the nursing notes.  Pertinent labs & imaging results that were available during my care of the patient were reviewed by me and considered in my medical decision making (see chart for details).     Patient complaining of symptoms of sinusitis.  Mild to moderate symptoms of clear/yellow nasal discharge/congestion and scratchy throat with cough for less than 10 days.  Patient is afebrile.  No concern for acute bacterial rhinosinusitis; likely viral in nature.  Patient discharged with symptomatic treatment of Flonase and Afrin and advised of proper use.  Patient instructions given for warm saline nasal washes.  Recommendations for follow-up with primary care physician if symptoms persist. Discussed indications for return to the ED. Pt verbalized understanding of and agreement with plan and is safe for discharge home at this time.    Final Clinical Impressions(s) / ED Diagnoses   Final diagnoses:  Acute non-recurrent maxillary sinusitis    New Prescriptions New Prescriptions   FLUTICASONE (FLONASE) 50 MCG/ACT NASAL SPRAY    Place 2 sprays into both nostrils daily.   OXYMETAZOLINE (AFRIN NASAL SPRAY) 0.05 % NASAL SPRAY     Instill 2 to 3 sprays into each nostril twice daily for no more than 2 days (maximum dose: 2 doses/24 hours)   I personally performed the services described in this documentation, which was scribed in my presence. The recorded information has been reviewed and is accurate.     Jeanie Sewer, PA-C 01/25/17 1558    Raeford Razor, MD 02/02/17 1630

## 2017-03-12 ENCOUNTER — Emergency Department (HOSPITAL_COMMUNITY)
Admission: EM | Admit: 2017-03-12 | Discharge: 2017-03-12 | Disposition: A | Discharge status: Home / Self Care | Payer: Self-pay | Attending: Emergency Medicine | Admitting: Emergency Medicine

## 2017-03-12 ENCOUNTER — Encounter (HOSPITAL_COMMUNITY): Payer: Self-pay | Admitting: *Deleted

## 2017-03-12 DIAGNOSIS — Z79899 Other long term (current) drug therapy: Secondary | ICD-10-CM | POA: Insufficient documentation

## 2017-03-12 DIAGNOSIS — F17210 Nicotine dependence, cigarettes, uncomplicated: Secondary | ICD-10-CM | POA: Insufficient documentation

## 2017-03-12 DIAGNOSIS — M7918 Myalgia, other site: Secondary | ICD-10-CM

## 2017-03-12 DIAGNOSIS — M542 Cervicalgia: Secondary | ICD-10-CM | POA: Insufficient documentation

## 2017-03-12 MED ORDER — IBUPROFEN 600 MG PO TABS: 600.0000 mg | ORAL_TABLET | Freq: Four times a day (QID) | ORAL | 0 refills | Status: DC | PRN

## 2017-03-12 MED ORDER — CYCLOBENZAPRINE HCL 10 MG PO TABS: 10.0000 mg | ORAL_TABLET | Freq: Two times a day (BID) | ORAL | 0 refills | Status: AC | PRN

## 2017-03-12 MED ORDER — ACETAMINOPHEN 500 MG PO TABS: 500.0000 mg | ORAL_TABLET | Freq: Four times a day (QID) | ORAL | 0 refills | Status: AC | PRN

## 2017-03-12 NOTE — ED Provider Notes (Signed)
[facility]PT Provider Note   CSN: [id number] Arrival date & time: 03/12/17  0003     History   Chief Complaint Chief Complaint  Patient presents with  . Neck Pain    HPI David Gould is a 26 y.o. male with no significant past medical historywho presents today with chief complaint acute onset, constant back and neck pain. He states that on Sunday 5 days ago he was listening to music and "head banging and dancing". He states that the next day he developed aching neck pain which began radiating down his back.he states that pain is now "all over ", worsens with palpation and ambulation. He denies radiation down his extremities. Denies hitting his head or loss of consciousness. He denies numbness, tingling, weakness, bowel or bladder incontinence,saddle anesthesia, fever, chills, IV drug use, history of cancer, night sweats, chest pain, shortness of breath, or abdominal pain. He has not tried anything for his symptoms. 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 His work sent him here for further evaluation "to make sure that I could work".   The history is provided by the patient.    History reviewed. No pertinent past medical history.  There are no active problems to display for this patient.   History reviewed. No pertinent surgical history.     Home Medications    Prior to Admission medications   Medication Sig Start Date End Date Taking? Authorizing Provider  acetaminophen (TYLENOL) 500 MG tablet Take 1 tablet (500 mg total) by mouth every 6 (six) hours as needed. 03/12/17   Aveah Castell A, PA-C  cyclobenzaprine (FLEXERIL) 10 MG tablet Take 1 tablet (10 mg total) by mouth 2 (two) times daily as needed for muscle spasms. 03/12/17   Everlee Quakenbush A, PA-C  fluticasone (FLONASE) 50 MCG/ACT nasal spray Place 2 sprays into both nostrils daily. 01/25/17   Luevenia Maxin, Taegan Standage A, PA-C  ibuprofen (ADVIL,MOTRIN) 600 MG tablet Take 1 tablet (600 mg total) by mouth every 6  (six) hours as needed. 03/12/17   Luevenia Maxin, Khiree Bukhari A, PA-C  ondansetron (ZOFRAN ODT) 8 MG disintegrating tablet  ODT q4 hours prn nausea 11/04/16   Geoffery Lyons, MD  oxymetazoline (AFRIN NASAL SPRAY) 0.05 % nasal spray Instill 2 to 3 sprays into each nostril twice daily for no more than 2 days (maximum dose: 2 doses/24 hours) 01/25/17   Jeanie Sewer, PA-C    Family History No family history on file.  Social History Social History  Substance Use Topics  . Smoking status: Current Every Day Smoker    Types: Cigarettes  . Smokeless tobacco: Never Used  . Alcohol use Yes     Comment: weekly     Allergies   Patient has no known allergies.   Review of Systems Review of Systems  Constitutional: Negative for chills and fatigue.  Eyes: Negative for visual disturbance.  Respiratory: Negative for shortness of breath.   Cardiovascular: Negative for chest pain.  Gastrointestinal: Negative for abdominal pain, nausea and vomiting.  Genitourinary: Negative for dysuria.       No bowel or bladder incontinence  Musculoskeletal: Positive for back pain, myalgias and neck pain.  Neurological: Negative for syncope, weakness and numbness.  All other systems reviewed and are negative.    Physical Exam Updated Vital Signs BP 116/84 (BP Location: Right Arm)   Pulse 74   Temp 98.1 F (36.7 C) (Oral)   Resp 16   Ht  (1.753 m)  Wt 61.2 kg (135 lb)   SpO2 99%   BMI 19.94 kg/m   Physical Exam  Constitutional: He is oriented to person, place, and time. He appears well-developed and well-nourished. No distress.  HENT:  Head: Normocephalic and atraumatic.  Right Ear: External ear normal.  Left Ear: External ear normal.  Mouth/Throat: Oropharynx is clear and moist.  Eyes: Pupils are equal, round, and reactive to light. Conjunctivae and EOM are normal. Right eye exhibits no discharge. Left eye exhibits no discharge.  Neck: Normal range of motion. Neck supple. No JVD present. No tracheal  deviation present.  Cardiovascular: Normal rate and intact distal pulses.   2+ radial and DP/PT pulses bl, negative Homan's bl   Pulmonary/Chest: Effort normal. He exhibits no tenderness.  Abdominal: Soft. Bowel sounds are normal. He exhibits no distension. There is no tenderness.  Musculoskeletal: Normal range of motion. He exhibits tenderness. He exhibits no edema.  Diffuse ttp of the CSP, TSP, and LSP with diffuse paraspinal muscle tenderness. No deformity, crepitus, or step-off noted. No ecchymosis or other signs of trauma noted. Negative straight leg raise bilaterally. Pain worsens with flexion and extension of the spine. 5/5 strength of BUE and BLE major muscle groups.  Neurological: He is alert and oriented to person, place, and time. No cranial nerve deficit or sensory deficit. He exhibits normal muscle tone.  Mental Status:  Alert, thought content appropriate, able to give a coherent history. Speech fluent without evidence of aphasia. Able to follow 2 step commands without difficulty.  Cranial Nerves:  II:  Peripheral visual fields grossly normal, pupils equal, round, reactive to light III,IV, VI: ptosis not present, extra-ocular motions intact bilaterally  V,VII: smile symmetric, facial light touch sensation equal VIII: hearing grossly normal to voice  X: uvula elevates symmetrically  XI: bilateral shoulder shrug symmetric and strong XII: midline tongue extension without fassiculations Motor:  Normal tone. 5/5 strength of BUE and BLE major muscle groups including strong and equal grip strength and dorsiflexion/plantar flexion Sensory: light touch normal in all extremities. Cerebellar: normal finger-to-nose with bilateral upper extremities, normal heel-to-shin with BLE.  Gait: normal gait and balance. Able to walk on toes and heels with ease.     Skin: Skin is warm and dry. No erythema.  Psychiatric: He has a normal mood and affect. His behavior is normal.  Nursing note and  vitals reviewed.    ED Treatments / Results  Labs (all labs ordered are listed, but only abnormal results are displayed) Labs Reviewed - No data to display  EKG  EKG Interpretation None       Radiology No results found.  Procedures Procedures (including critical care time)  Medications Ordered in ED Medications - No data to display   Initial Impression / Assessment and Plan / ED Course  I have reviewed the triage vital signs and the nursing notes.  Pertinent labs & imaging results that were available during my care of the patient were reviewed by me and considered in my medical decision making (see chart for details).    Patient with diffuse neck and back pain secondary to "head banging" to music 5 days ago. Afebrile, vital signs are stable. No concern for closed head injury, skull fracture, ICH, SAH, or other intracranial abnormality. No concern for cervical spine fracture or subluxation. No red flags concerning patient's back pain. No s/s of central cord compression or cauda equina. Extremities are neurovascularly intact and patient is ambulating without difficulty. Patient prescribed muscle relaxer and counseled  on proper use of muscle relaxant medication. Urged patient not to drink alcohol, drive, or perform any other activities that requires focus while taking this medication. Patient urged to follow-up with PCP if pain does not improve with treatment and rest or if pain becomes recurrent. Urged to return with worsening severe pain, loss of bowel or bladder control, or trouble walking. Pt verbalized understanding of and agreement with plan and is safe for discharge home at this time.   Final Clinical Impressions(s) / ED Diagnoses   Final diagnoses:  Musculoskeletal pain    New Prescriptions Discharge Medication List as of 03/12/2017  4:43 AM    START taking these medications   Details  acetaminophen (TYLENOL) 500 MG tablet Take 1 tablet (500 mg total) by mouth every  6 (six) hours as needed., Starting Fri 03/12/2017, Print    cyclobenzaprine (FLEXERIL) 10 MG tablet Take 1 tablet (10 mg total) by mouth 2 (two) times daily as needed for muscle spasms., Starting Fri 03/12/2017, Print    ibuprofen (ADVIL,MOTRIN) 600 MG tablet Take 1 tablet (600 mg total) by mouth every 6 (six) hours as needed., Starting Fri 03/12/2017, Print         Nabor Thomann, [place] A, PA-C 03/12/17 0865    Azalia Bilis, MD 03/12/17 [phone]

## 2017-03-12 NOTE — Discharge Instructions (Signed)
Alternate 600 mg of ibuprofen and [phone] mg of Tylenol every 3 hours as needed for pain. Do not exceed 4000 mg of Tylenol daily. You may take Flexeril up to twice daily as needed for muscle spasms. This medication may make you drowsy, so I typically only recommended at night. If this medication makes you drowsy throughout the day, no driving, drinking alcohol, or operating heavy machinery. You may also cut these tablets in half. Ice or heat to areas of soreness for the next few days, whichever feels best. Do some gentle stretching throughout the day, especially during hot showers or baths. Take short frequent walks and avoid prolonged periods of sitting or laying. Expect to be sore for the next few day and follow up with primary care physician for recheck of ongoing symptoms but return to ER for emergent changing or worsening of symptoms such as severe headache that gets worse, altered mental status/behaving unusually, persistent vomiting, excessive drowsiness, numbness to the arms or legs, loss of bowel or bladder control

## 2017-03-12 NOTE — ED Triage Notes (Signed)
Patient states this past Sunday he was listening to music and was doing some head banging.  C/o neck pain that has gotten better but now his back is uncomfortable.  States his work made him come to be checked

## 2017-09-05 ENCOUNTER — Emergency Department (HOSPITAL_BASED_OUTPATIENT_CLINIC_OR_DEPARTMENT_OTHER)
Admission: EM | Admit: 2017-09-05 | Discharge: 2017-09-05 | Disposition: A | Discharge status: Home / Self Care | Payer: BLUE CROSS/BLUE SHIELD | Attending: Emergency Medicine | Admitting: Emergency Medicine

## 2017-09-05 ENCOUNTER — Encounter (HOSPITAL_BASED_OUTPATIENT_CLINIC_OR_DEPARTMENT_OTHER): Payer: Self-pay | Admitting: Emergency Medicine

## 2017-09-05 ENCOUNTER — Other Ambulatory Visit: Payer: Self-pay

## 2017-09-05 DIAGNOSIS — B349 Viral infection, unspecified: Secondary | ICD-10-CM | POA: Insufficient documentation

## 2017-09-05 DIAGNOSIS — R070 Pain in throat: Secondary | ICD-10-CM | POA: Diagnosis present

## 2017-09-05 DIAGNOSIS — F17210 Nicotine dependence, cigarettes, uncomplicated: Secondary | ICD-10-CM | POA: Diagnosis not present

## 2017-09-05 DIAGNOSIS — J029 Acute pharyngitis, unspecified: Secondary | ICD-10-CM

## 2017-09-05 DIAGNOSIS — Z79899 Other long term (current) drug therapy: Secondary | ICD-10-CM | POA: Insufficient documentation

## 2017-09-05 LAB — RAPID STREP SCREEN (MED CTR MEBANE ONLY): STREPTOCOCCUS, GROUP A SCREEN (DIRECT): NEGATIVE

## 2017-09-05 NOTE — ED Triage Notes (Signed)
Patient states that he has had a sore throat with painful swallowing since Friday.

## 2017-09-05 NOTE — ED Notes (Signed)
Given  popsicle

## 2017-09-05 NOTE — Discharge Instructions (Signed)
Please read instructions below.  You can take tylenol or ibuprofen as needed for sore throat. Drink plenty of water.  Follow up with your primary care provider as needed.  Return to the ER for difficulty swallowing liquids, difficulty breathing, or new or worsening symptoms.

## 2017-09-05 NOTE — ED Provider Notes (Signed)
[facility] EMERGENCY DEPARTMENT Provider Note   CSN: [id number] Arrival date & time: 09/05/17  1608     History   Chief Complaint Chief Complaint  Patient presents with  . Sore Throat    HPI David Gould is a 27 y.o. male without significant past medical history, presenting to the ED with sore throat and nasal congestion that began on Friday.  Patient states his throat is painful with swallowing.  He has been taking ibuprofen and drinking warm tea with moderate relief.  He denies ear pain, cough, fever, difficulty breathing or swallowing, or any other associated symptoms.  The history is provided by the patient.    History reviewed. No pertinent past medical history.  There are no active problems to display for this patient.   History reviewed. No pertinent surgical history.      Home Medications    Prior to Admission medications   Medication Sig Start Date End Date Taking? Authorizing Provider  acetaminophen (TYLENOL) 500 MG tablet Take 1 tablet (500 mg total) by mouth every 6 (six) hours as needed. 03/12/17   Fawze, Mina A, PA-C  cyclobenzaprine (FLEXERIL) 10 MG tablet Take 1 tablet (10 mg total) by mouth 2 (two) times daily as needed for muscle spasms. 03/12/17   Fawze, Mina A, PA-C  fluticasone (FLONASE) 50 MCG/ACT nasal spray Place 2 sprays into both nostrils daily. 01/25/17   Luevenia MaxinFawze, Mina A, PA-C  ibuprofen (ADVIL,MOTRIN) 600 MG tablet Take 1 tablet (600 mg total) by mouth every 6 (six) hours as needed. 03/12/17   Luevenia MaxinFawze, Mina A, PA-C  ondansetron (ZOFRAN ODT) 8 MG disintegrating tablet 8mg  ODT q4 hours prn nausea 11/04/16   Geoffery Lyonselo, [place], MD  oxymetazoline (AFRIN NASAL SPRAY) 0.05 % nasal spray Instill 2 to 3 sprays into each nostril twice daily for no more than 2 days (maximum dose: 2 doses/24 hours) 01/25/17   Jeanie SewerFawze, Mina A, PA-C    Family History History reviewed. No pertinent family history.  Social History Social History   Tobacco Use  . Smoking  status: Current Every Day Smoker    Types: Cigarettes  . Smokeless tobacco: Never Used  Substance Use Topics  . Alcohol use: Yes    Comment: weekly  . Drug use: Yes    Types: Marijuana     Allergies   Patient has no known allergies.   Review of Systems Review of Systems  Constitutional: Negative for fever.  HENT: Positive for congestion and sore throat. Negative for ear pain, trouble swallowing and voice change.   Respiratory: Negative for cough.      Physical Exam Updated Vital Signs BP 128/78 (BP Location: Left Arm)   Pulse 96   Temp 98.7 F (37.1 C) (Oral)   Resp 18   Ht 5\' 9"  (1.753 m)   Wt 61.2 kg (135 lb)   SpO2 100%   BMI 19.94 kg/m   Physical Exam  Constitutional: He appears well-developed and well-nourished.  Well-appearing, tolerating secretions.  HENT:  Head: Normocephalic and atraumatic.  Right Ear: Tympanic membrane, external ear and ear canal normal.  Left Ear: Tympanic membrane, external ear and ear canal normal.  Posterior oropharyngeal erythema, no edema or exudates.  Uvula midline, no trismus.  Eyes: Conjunctivae are normal.  Neck: Normal range of motion. Neck supple.  Cardiovascular: Normal rate, regular rhythm, normal heart sounds and intact distal pulses.  Pulmonary/Chest: Effort normal and breath sounds normal. No respiratory distress.  Lymphadenopathy:    He has no cervical adenopathy.  Psychiatric: He has a normal mood and affect. His behavior is normal.  Nursing note and vitals reviewed.    ED Treatments / Results  Labs (all labs ordered are listed, but only abnormal results are displayed) Labs Reviewed  RAPID STREP SCREEN (NOT AT [facility])  CULTURE, GROUP A STREP [facility])    EKG None  Radiology No results found.  Procedures Procedures (including critical care time)  Medications Ordered in ED Medications - No data to display   Initial Impression / Assessment and Plan / ED Course  I have reviewed the triage vital signs and  the nursing notes.  Pertinent labs & imaging results that were available during my care of the patient were reviewed by me and considered in my medical decision making (see chart for details).     Pt afebrile without tonsillar exudate, negative strep. Presents with sore throat, congestion & dysphagia; diagnosis of viral pharyngitis. No abx indicated. DC w symptomatic tx for pain  Pt does not appear dehydrated, but did discuss importance of water rehydration. Presentation non concerning for PTA or infxn spread to soft tissue. No trismus or uvula deviation. Specific return precautions discussed. Pt able to drink water in ED without difficulty with intact air way. Recommended PCP follow up.  Discussed results, findings, treatment and follow up. Patient advised of return precautions. Patient verbalized understanding and agreed with plan.  Final Clinical Impressions(s) / ED Diagnoses   Final diagnoses:  Viral pharyngitis    ED Discharge Orders    None       David Gould, [place] N, PA-C 09/05/17 1807    Little, Ambrose Finland, MD 09/06/17 [phone]

## 2017-09-08 LAB — CULTURE, GROUP A STREP (THRC)

## 2017-10-13 ENCOUNTER — Other Ambulatory Visit: Payer: Self-pay

## 2017-10-13 ENCOUNTER — Emergency Department (HOSPITAL_BASED_OUTPATIENT_CLINIC_OR_DEPARTMENT_OTHER)
Admission: EM | Admit: 2017-10-13 | Discharge: 2017-10-14 | Disposition: A | Discharge status: Home / Self Care | Payer: BLUE CROSS/BLUE SHIELD | Attending: Emergency Medicine | Admitting: Emergency Medicine

## 2017-10-13 ENCOUNTER — Emergency Department (HOSPITAL_BASED_OUTPATIENT_CLINIC_OR_DEPARTMENT_OTHER): Payer: BLUE CROSS/BLUE SHIELD

## 2017-10-13 ENCOUNTER — Encounter (HOSPITAL_BASED_OUTPATIENT_CLINIC_OR_DEPARTMENT_OTHER): Payer: Self-pay

## 2017-10-13 DIAGNOSIS — Z79899 Other long term (current) drug therapy: Secondary | ICD-10-CM | POA: Insufficient documentation

## 2017-10-13 DIAGNOSIS — F17210 Nicotine dependence, cigarettes, uncomplicated: Secondary | ICD-10-CM | POA: Insufficient documentation

## 2017-10-13 DIAGNOSIS — R1033 Periumbilical pain: Secondary | ICD-10-CM | POA: Diagnosis present

## 2017-10-13 DIAGNOSIS — K5000 Crohn's disease of small intestine without complications: Secondary | ICD-10-CM | POA: Insufficient documentation

## 2017-10-13 LAB — CBC WITH DIFFERENTIAL/PLATELET
Basophils Absolute: 0 10*3/uL (ref 0.0–0.1)
Basophils Relative: 0 %
EOS PCT: 3 %
Eosinophils Absolute: 0.3 10*3/uL (ref 0.0–0.7)
HEMATOCRIT: 40.9 % (ref 39.0–52.0)
Hemoglobin: 15.6 g/dL (ref 13.0–17.0)
Lymphocytes Relative: 24 %
Lymphs Abs: 2.4 10*3/uL (ref 0.7–4.0)
MCH: 33.2 pg (ref 26.0–34.0)
MCHC: 38.1 g/dL — ABNORMAL HIGH (ref 30.0–36.0)
MCV: 87 fL (ref 78.0–100.0)
MONO ABS: 1.3 10*3/uL — AB (ref 0.1–1.0)
Monocytes Relative: 13 %
NEUTROS ABS: 6.1 10*3/uL (ref 1.7–7.7)
Neutrophils Relative %: 60 %
PLATELETS: 258 10*3/uL (ref 150–400)
RBC: 4.7 MIL/uL (ref 4.22–5.81)
RDW: 12.5 % (ref 11.5–15.5)
WBC: 10.1 10*3/uL (ref 4.0–10.5)

## 2017-10-13 LAB — COMPREHENSIVE METABOLIC PANEL
ALT: 10 U/L — AB (ref 17–63)
AST: 17 U/L (ref 15–41)
Albumin: 4 g/dL (ref 3.5–5.0)
Alkaline Phosphatase: 67 U/L (ref 38–126)
Anion gap: 11 (ref 5–15)
BUN: 9 mg/dL (ref 6–20)
CALCIUM: 9 mg/dL (ref 8.9–10.3)
CO2: 21 mmol/L — AB (ref 22–32)
CREATININE: 0.8 mg/dL (ref 0.61–1.24)
Chloride: 106 mmol/L (ref 101–111)
Glucose, Bld: 92 mg/dL (ref 65–99)
Potassium: 3.8 mmol/L (ref 3.5–5.1)
Sodium: 138 mmol/L (ref 135–145)
Total Bilirubin: 0.9 mg/dL (ref 0.3–1.2)
Total Protein: 7.3 g/dL (ref 6.5–8.1)

## 2017-10-13 LAB — URINALYSIS, ROUTINE W REFLEX MICROSCOPIC
Bilirubin Urine: NEGATIVE
GLUCOSE, UA: NEGATIVE mg/dL
Hgb urine dipstick: NEGATIVE
Ketones, ur: 15 mg/dL — AB
LEUKOCYTES UA: NEGATIVE
Nitrite: NEGATIVE
PROTEIN: NEGATIVE mg/dL
SPECIFIC GRAVITY, URINE: 1.025 (ref 1.005–1.030)
pH: 6 (ref 5.0–8.0)

## 2017-10-13 LAB — LIPASE, BLOOD: LIPASE: 39 U/L (ref 11–51)

## 2017-10-13 MED ORDER — MORPHINE SULFATE (PF) 4 MG/ML IV SOLN
6.0000 mg | Freq: Once | INTRAVENOUS | Status: AC
  Administered 2017-10-13: 6 mg via INTRAVENOUS
  Filled 2017-10-13: qty 2

## 2017-10-13 MED ORDER — IOPAMIDOL (ISOVUE-300) INJECTION 61%
100.0000 mL | Freq: Once | INTRAVENOUS | Status: AC | PRN
  Administered 2017-10-14: 100 mL via INTRAVENOUS

## 2017-10-13 MED ORDER — SODIUM CHLORIDE 0.9 % IV BOLUS
1000.0000 mL | Freq: Once | INTRAVENOUS | Status: AC
  Administered 2017-10-13: 1000 mL via INTRAVENOUS

## 2017-10-13 MED ORDER — ONDANSETRON HCL 4 MG/2ML IJ SOLN
4.0000 mg | Freq: Once | INTRAMUSCULAR | Status: AC
  Administered 2017-10-13: 4 mg via INTRAVENOUS
  Filled 2017-10-13: qty 2

## 2017-10-13 MED ORDER — GI COCKTAIL ~~LOC~~
30.0000 mL | Freq: Once | ORAL | Status: AC
  Administered 2017-10-13: 30 mL via ORAL
  Filled 2017-10-13: qty 30

## 2017-10-13 NOTE — ED Triage Notes (Signed)
Generalized abdominal pain with vomiting since midnight, unrelieved by Pepto, no diarrhea, no sick contacts, no fevers

## 2017-10-13 NOTE — ED Provider Notes (Signed)
[facility] EMERGENCY DEPARTMENT Provider Note   CSN: [id number] Arrival date & time: 10/13/17  1801     History   Chief Complaint Chief Complaint  Patient presents with  . Abdominal Pain    HPI David Gould is a 27 y.o. male.  HPI  27 year old male presents with abdominal pain, vomiting.  He states it started around midnight.  He had multiple episodes of emesis but no diarrhea.  He has been having periumbilical abdominal pain.  He states this feels similar to when he had gastritis as a kid.  The pain is sharp.  He noted a fever of 101 this morning.  No shortness of breath or chest pain.  No back pain or urinary symptoms.  Pain is rated as moderate at this time.  History reviewed. No pertinent past medical history.  There are no active problems to display for this patient.   History reviewed. No pertinent surgical history.      Home Medications    Prior to Admission medications   Medication Sig Start Date End Date Taking? Authorizing Provider  acetaminophen (TYLENOL) 500 MG tablet Take 1 tablet (500 mg total) by mouth every 6 (six) hours as needed. 03/12/17   Fawze, Mina A, PA-C  cyclobenzaprine (FLEXERIL) 10 MG tablet Take 1 tablet (10 mg total) by mouth 2 (two) times daily as needed for muscle spasms. 03/12/17   Fawze, Mina A, PA-C  fluticasone (FLONASE) 50 MCG/ACT nasal spray Place 2 sprays into both nostrils daily. 01/25/17   Luevenia Maxin, Mina A, PA-C  ibuprofen (ADVIL,MOTRIN) 600 MG tablet Take 1 tablet (600 mg total) by mouth every 6 (six) hours as needed. 03/12/17   Luevenia Maxin, Mina A, PA-C  ondansetron (ZOFRAN ODT) 8 MG disintegrating tablet  ODT q4 hours prn nausea 11/04/16   Geoffery Lyons, MD  oxymetazoline (AFRIN NASAL SPRAY) 0.05 % nasal spray Instill 2 to 3 sprays into each nostril twice daily for no more than 2 days (maximum dose: 2 doses/24 hours) 01/25/17   Jeanie Sewer, PA-C    Family History No family history on file.  Social History Social History    Tobacco Use  . Smoking status: Current Every Day Smoker    Types: Cigarettes  . Smokeless tobacco: Never Used  Substance Use Topics  . Alcohol use: Yes    Comment: weekly  . Drug use: Yes    Types: Marijuana     Allergies   Patient has no known allergies.   Review of Systems Review of Systems  Constitutional: Positive for fever.  Respiratory: Negative for shortness of breath.   Cardiovascular: Negative for chest pain.  Gastrointestinal: Positive for abdominal pain, nausea and vomiting. Negative for diarrhea.  Genitourinary: Negative for dysuria.  Musculoskeletal: Negative for back pain.  All other systems reviewed and are negative.    Physical Exam Updated Vital Signs BP 116/78   Pulse (!) 49   Temp 99 F (37.2 C) (Oral)   Resp 16   Ht  (1.753 m)   Wt 61.2 kg (135 lb)   SpO2 99%   BMI 19.94 kg/m   Physical Exam  Constitutional: He is oriented to person, place, and time. He appears well-developed and well-nourished.  Non-toxic appearance. He does not appear ill. No distress.  HENT:  Head: Normocephalic and atraumatic.  Right Ear: External ear normal.  Left Ear: External ear normal.  Nose: Nose normal.  Eyes: Right eye exhibits no discharge. Left eye exhibits no discharge.  Neck: Neck supple.  Cardiovascular: Normal rate, regular rhythm and normal heart sounds.  Pulmonary/Chest: Effort normal and breath sounds normal.  Abdominal: Soft. He exhibits no distension. There is no tenderness.  Reports no tenderness, but occasionally grimaces on exam but is non-focal  Musculoskeletal: He exhibits no edema.  Neurological: He is alert and oriented to person, place, and time.  Skin: Skin is warm and dry.  Nursing note and vitals reviewed.    ED Treatments / Results  Labs (all labs ordered are listed, but only abnormal results are displayed) Labs Reviewed  COMPREHENSIVE METABOLIC PANEL - Abnormal; Notable for the following components:      Result Value    CO2 21 (*)    ALT 10 (*)    All other components within normal limits  CBC WITH DIFFERENTIAL/PLATELET - Abnormal; Notable for the following components:   MCHC 38.1 (*)    Monocytes Absolute 1.3 (*)    All other components within normal limits  URINALYSIS, ROUTINE W REFLEX MICROSCOPIC - Abnormal; Notable for the following components:   Ketones, ur 15 (*)    All other components within normal limits  LIPASE, BLOOD    EKG None  Radiology No results found.  Procedures Procedures (including critical care time)  Medications Ordered in ED Medications  sodium chloride 0.9 % bolus 1,000 mL (0 mLs Intravenous Stopped 10/13/17 2138)  ondansetron (ZOFRAN) injection 4 mg (4 mg Intravenous Given 10/13/17 2049)  gi cocktail (Maalox,Lidocaine,Donnatal) (30 mLs Oral Given 10/13/17 2047)  morphine 4 MG/ML injection 6 mg (6 mg Intravenous Given 10/13/17 2249)  iopamidol (ISOVUE-300) 61 % injection 100 mL (100 mLs Intravenous Contrast Given 10/14/17 0016)     Initial Impression / Assessment and Plan / ED Course  I have reviewed the triage vital signs and the nursing notes.  Pertinent labs & imaging results that were available during my care of the patient were reviewed by me and considered in my medical decision making (see chart for details).     Patient's abdominal pain is vague.  However since he felt like it was like prior gastritis, GI cocktail given.  However no relief of pain.  On reexamination his pain is now solely in the right lower quadrant on exam.  Thus CT will be obtained to rule out appendicitis.  Care transferred to Dr. Elesa Massed with CT pending.  Final Clinical Impressions(s) / ED Diagnoses   Final diagnoses:  None    ED Discharge Orders    None       Pricilla Loveless, MD 10/14/17 [phone]

## 2017-10-13 NOTE — ED Notes (Signed)
Patient reports about noon today he started with nausea and vomiting.  Stated he was able to eat some food prior to coming to the ED and he has not vomited since.  Bowel sounds presents in all quads.

## 2017-10-13 NOTE — ED Notes (Signed)
Patient drinking contrast

## 2017-10-14 MED ORDER — ONDANSETRON 4 MG PO TBDP: 4.0000 mg | ORAL_TABLET | Freq: Four times a day (QID) | ORAL | 0 refills | Status: DC | PRN

## 2017-10-14 MED ORDER — AMOXICILLIN-POT CLAVULANATE 500-125 MG PO TABS: 1.0000 | ORAL_TABLET | Freq: Three times a day (TID) | ORAL | 0 refills | Status: DC

## 2017-10-14 MED ORDER — AMOXICILLIN-POT CLAVULANATE 875-125 MG PO TABS
1.0000 | ORAL_TABLET | Freq: Once | ORAL | Status: AC
  Administered 2017-10-14: 1 via ORAL
  Filled 2017-10-14: qty 1

## 2017-10-14 MED ORDER — HYDROCODONE-ACETAMINOPHEN 5-325 MG PO TABS: 1.0000 | ORAL_TABLET | Freq: Four times a day (QID) | ORAL | 0 refills | Status: DC | PRN

## 2017-10-14 NOTE — ED Provider Notes (Signed)
12:00 AM  Assumed care from Dr. Regenia Skeeter.  Patient is a 27 year old male who presents to the emergency department with fever, abdominal pain, nausea and vomiting.  Labs are unremarkable.  CT scan pending for further evaluation and to rule out appendicitis.   2:30 AM  Pt's CT scan shows terminal ileitis.  Normal-appearing appendix.  No bowel obstruction.  He does report fever at home and given sudden onset we will treat as if this is infectious in nature with Augmentin.  We did discuss with him that this could represent inflammatory bowel disease such as Crohn's, ulcerative colitis.  He denies family history or personal history of inflammatory disease.  Have recommended close follow-up with a gastroenterologist.  He verbalized understanding.  I have also given him outpatient PCP follow-up.  Will discharge patient home with nausea medicine, pain medication and Augmentin.  He reports feeling much better in the ED.  Denies bloody stools or melena.   At this time, I do not feel there is any life-threatening condition present. I have reviewed and discussed all results (EKG, imaging, lab, urine as appropriate) and exam findings with patient/family. I have reviewed nursing notes and appropriate previous records.  I feel the patient is safe to be discharged home without further emergent workup and can continue workup as an outpatient as needed. Discussed usual and customary return precautions. Patient/family verbalize understanding and are comfortable with this plan.  Outpatient follow-up has been provided if needed. All questions have been answered.     David Gould, Delice Bison, DO 10/14/17 0500

## 2017-10-14 NOTE — Discharge Instructions (Signed)
You have inflammation of the end of your ileum which is part of your small intestine.  Given you have had fever and this was acute in onset this may be infectious in nature but also could be inflammatory bowel disease such as Crohn's disease or ulcerative colitis.  We are sending you home with a prescription for antibiotics for the next week and then you will need to follow-up closely with a primary care physician as well as a gastroenterologist.  If you begin having worsening pain, vomiting, continued fevers, blood in your stool, black and tarry stools, please return to the emergency department.   To find a primary care or specialty doctor please call [phone] or [phone] to access "[place] a Doctor Service."  You may also go on the [facility] website at [email or web address]  There are also multiple [facility], [facility], [facility] and [facility] practices throughout the [place] that are frequently accepting new patients. You may find a clinic that is close to your home and contact them.  [place] [phone] [place]  [place] [address] [place] [phone]

## 2017-10-14 NOTE — ED Notes (Signed)
Pt verbalizes understanding of d/c instructions and denies any further needs at this time. 

## 2017-10-14 NOTE — ED Notes (Signed)
Patient ambulatory to restroom  ?

## 2017-10-15 ENCOUNTER — Telehealth: Payer: Self-pay | Admitting: Gastroenterology

## 2017-10-15 ENCOUNTER — Encounter: Payer: Self-pay | Admitting: Gastroenterology

## 2017-10-15 NOTE — Telephone Encounter (Signed)
Left message with patients friend to have patient call back and schedule ov with Dr.Armbruster. Staff message was sent via Dr.Armbruster to sch ov due to hosp ov from 5.8.19 for abd pain.

## 2017-12-07 ENCOUNTER — Ambulatory Visit: Payer: BLUE CROSS/BLUE SHIELD | Admitting: Gastroenterology

## 2018-08-09 ENCOUNTER — Other Ambulatory Visit: Payer: Self-pay

## 2018-08-09 ENCOUNTER — Encounter (HOSPITAL_COMMUNITY): Payer: Self-pay | Admitting: *Deleted

## 2018-08-09 ENCOUNTER — Emergency Department (HOSPITAL_COMMUNITY): Payer: BLUE CROSS/BLUE SHIELD

## 2018-08-09 ENCOUNTER — Emergency Department (HOSPITAL_COMMUNITY)
Admission: EM | Admit: 2018-08-09 | Discharge: 2018-08-09 | Disposition: A | Discharge status: Home / Self Care | Payer: BLUE CROSS/BLUE SHIELD | Attending: Emergency Medicine | Admitting: Emergency Medicine

## 2018-08-09 DIAGNOSIS — R69 Illness, unspecified: Secondary | ICD-10-CM

## 2018-08-09 DIAGNOSIS — F17210 Nicotine dependence, cigarettes, uncomplicated: Secondary | ICD-10-CM | POA: Diagnosis not present

## 2018-08-09 DIAGNOSIS — R05 Cough: Secondary | ICD-10-CM | POA: Diagnosis present

## 2018-08-09 DIAGNOSIS — J111 Influenza due to unidentified influenza virus with other respiratory manifestations: Secondary | ICD-10-CM | POA: Diagnosis not present

## 2018-08-09 MED ORDER — PREDNISONE 10 MG (21) PO TBPK: ORAL_TABLET | ORAL | 0 refills | Status: AC

## 2018-08-09 MED ORDER — ALBUTEROL SULFATE HFA 108 (90 BASE) MCG/ACT IN AERS: 2.0000 | INHALATION_SPRAY | RESPIRATORY_TRACT | 0 refills | Status: AC | PRN

## 2018-08-09 MED ORDER — BENZONATATE 100 MG PO CAPS: 100.0000 mg | ORAL_CAPSULE | Freq: Three times a day (TID) | ORAL | 0 refills | Status: AC

## 2018-08-09 MED ORDER — FLUTICASONE PROPIONATE 50 MCG/ACT NA SUSP: 2.0000 | Freq: Every day | NASAL | 0 refills | Status: AC

## 2018-08-09 MED ORDER — ONDANSETRON 4 MG PO TBDP: 4.0000 mg | ORAL_TABLET | Freq: Three times a day (TID) | ORAL | 0 refills | Status: AC | PRN

## 2018-08-09 NOTE — ED Provider Notes (Signed)
[facility] DEPT Provider Note   CSN: [id number] Arrival date & time: 08/09/18  1913    History   Chief Complaint Chief Complaint  Patient presents with  . Cough    HPI David Gould is a 28 y.o. male.     HPI   David Gould is a 28 y.o. male, patient with no pertinent past medical history, presenting to the ED with cough for the last week.  Also complains of nasal congestion and sinus pressure over the same time period.  3 days ago, cough became productive and patient began to have subjective fever, chills, and body aches. Nausea and one episode of nonbloody nonbilious emesis today.  Intermittent shortness of breath. He has tried multiple over-the-counter therapies. Denies syncope, confusion, neuro deficits, neck pain/stiffness, rashes, abdominal pain, chest pain, or any other complaints.    History reviewed. No pertinent past medical history.  There are no active problems to display for this patient.   History reviewed. No pertinent surgical history.      Home Medications    Prior to Admission medications   Medication Sig Start Date End Date Taking? Authorizing Provider  acetaminophen (TYLENOL) 500 MG tablet Take 1 tablet (500 mg total) by mouth every 6 (six) hours as needed. 03/12/17   Fawze, Mina A, PA-C  albuterol (PROVENTIL HFA;VENTOLIN HFA) 108 (90 Base) MCG/ACT inhaler Inhale 2 puffs into the lungs every 4 (four) hours as needed for wheezing or shortness of breath. 08/09/18   Joy, Shawn C, PA-C  benzonatate (TESSALON) 100 MG capsule Take 1 capsule (100 mg total) by mouth every 8 (eight) hours. 08/09/18   Joy, Shawn C, PA-C  cyclobenzaprine (FLEXERIL) 10 MG tablet Take 1 tablet (10 mg total) by mouth 2 (two) times daily as needed for muscle spasms. 03/12/17   Fawze, Mina A, PA-C  fluticasone (FLONASE) 50 MCG/ACT nasal spray Place 2 sprays into both nostrils daily. 08/09/18   Joy, Shawn C, PA-C  ibuprofen (ADVIL,MOTRIN) 600 MG tablet Take 1  tablet (600 mg total) by mouth every 6 (six) hours as needed. 03/12/17   Fawze, Mina A, PA-C  ondansetron (ZOFRAN ODT) 4 MG disintegrating tablet Take 1 tablet (4 mg total) by mouth every 8 (eight) hours as needed for nausea or vomiting. 08/09/18   Joy, Shawn C, PA-C  oxymetazoline (AFRIN NASAL SPRAY) 0.05 % nasal spray Instill 2 to 3 sprays into each nostril twice daily for no more than 2 days (maximum dose: 2 doses/24 hours) 01/25/17   Rodell Perna A, PA-C  predniSONE (STERAPRED UNI-PAK 21 TAB) 10 MG (21) TBPK tablet Take 6 tabs (37m) day 1, 5 tabs (553m day 2, 4 tabs (4060mday 3, 3 tabs (61m47may 4, 2 tabs (20mg64my 5, and 1 tab (10mg)2m 6. 08/09/18   Joy, Shawn [name]    Family History No family history on file.  Social History Social History   Tobacco Use  . Smoking status: Current Every Day Smoker    Types: Cigarettes  . Smokeless tobacco: Never Used  Substance Use Topics  . Alcohol use: Yes    Comment: weekly  . Drug use: Yes    Types: Marijuana     Allergies   Patient has no known allergies.   Review of Systems Review of Systems  Constitutional: Positive for chills and fever.  HENT: Positive for congestion and sinus pressure. Negative for trouble swallowing.   Respiratory: Positive for cough and shortness of breath (occasional).   Gastrointestinal:  Positive for diarrhea, nausea and vomiting. Negative for abdominal pain and blood in stool.  Musculoskeletal: Positive for myalgias. Negative for back pain.  Neurological: Negative for dizziness, syncope and light-headedness.  All other systems reviewed and are negative.    Physical Exam Updated Vital Signs BP (!) 139/97 (BP Location: Right Arm)   Pulse (!) 115   Temp 98.8 F (37.1 C) (Oral)   Resp 18   Ht 5' 9"  (1.753 m)   Wt 62.6 kg   SpO2 97%   BMI 20.38 kg/m   Physical Exam Vitals signs and nursing note reviewed.  Constitutional:      General: He is not in acute distress.    Appearance: He is  well-developed. He is not diaphoretic.  HENT:     Head: Normocephalic and atraumatic.     Right Ear: Ear canal and external ear normal. A middle ear effusion is present.     Left Ear: Ear canal and external ear normal. A middle ear effusion is present.     Nose: Mucosal edema and congestion present.     Mouth/Throat:     Mouth: Mucous membranes are moist.     Pharynx: Oropharynx is clear.  Eyes:     Conjunctiva/sclera: Conjunctivae normal.  Neck:     Musculoskeletal: Neck supple.  Cardiovascular:     Rate and Rhythm: Normal rate and regular rhythm.     Pulses: Normal pulses.     Heart sounds: Normal heart sounds.     Comments: Tactile temperature in the extremities appropriate and equal bilaterally. Not tachycardic upon my exam. Pulmonary:     Effort: Pulmonary effort is normal. No respiratory distress.     Comments: No increased work of breathing.  Speaks in full sentences without difficulty. Abdominal:     Palpations: Abdomen is soft.     Tenderness: There is no abdominal tenderness. There is no guarding.  Musculoskeletal:     Right lower leg: No edema.     Left lower leg: No edema.  Lymphadenopathy:     Cervical: No cervical adenopathy.  Skin:    General: Skin is warm and dry.  Neurological:     Mental Status: He is alert.  Psychiatric:        Mood and Affect: Mood and affect normal.        Speech: Speech normal.        Behavior: Behavior normal.      ED Treatments / Results  Labs (all labs ordered are listed, but only abnormal results are displayed) Labs Reviewed - No data to display  EKG None  Radiology Dg Chest 2 View  Result Date: 08/09/2018 CLINICAL DATA:  Cough, fever and body aches for 1 week. EXAM: CHEST - 2 VIEW COMPARISON:  Chest radiograph December 15, 2016 FINDINGS: Cardiomediastinal silhouette is normal. No pleural effusions or focal consolidations. Trachea projects midline and there is no pneumothorax. Soft tissue planes and included osseous structures  are non-suspicious. IMPRESSION: Negative. Electronically Signed   By: Elon Alas M.D.   On: 08/09/2018 21:57    Procedures Procedures (including critical care time)  Medications Ordered in ED Medications - No data to display   Initial Impression / Assessment and Plan / ED Course  I have reviewed the triage vital signs and the nursing notes.  Pertinent labs & imaging results that were available during my care of the patient were reviewed by me and considered in my medical decision making (see chart for details).  Patient presents with symptoms suggestive of viral syndrome. Patient is nontoxic appearing, afebrile, not tachycardic on my exam, not tachypneic, not hypotensive, maintains excellent SPO2 on room air, and is in no apparent distress.  No acute abnormality on chest x-ray. The patient was given instructions for home care as well as return precautions. Patient voices understanding of these instructions, accepts the plan, and is comfortable with discharge.   Vitals:   08/09/18 1920 08/09/18 2128  BP: (!) 139/97 (!) 128/91  Pulse: (!) 115 92  Resp: 18 18  Temp: 98.8 F (37.1 C) 98.2 F (36.8 C)  TempSrc: Oral Oral  SpO2: 97% 97%  Weight: 62.6 kg   Height: 5' 9"  (1.753 m)      Final Clinical Impressions(s) / ED Diagnoses   Final diagnoses:  Influenza-like illness    ED Discharge Orders         Ordered    ondansetron (ZOFRAN ODT) 4 MG disintegrating tablet  Every 8 hours PRN     08/09/18 2216    benzonatate (TESSALON) 100 MG capsule  Every 8 hours     08/09/18 2216    predniSONE (STERAPRED UNI-PAK 21 TAB) 10 MG (21) TBPK tablet     08/09/18 2216    albuterol (PROVENTIL HFA;VENTOLIN HFA) 108 (90 Base) MCG/ACT inhaler  Every 4 hours PRN     08/09/18 2216    fluticasone (FLONASE) 50 MCG/ACT nasal spray  Daily     08/09/18 2216           Lorayne Bender, PA-C 08/11/18 0045    Daleen Bo, MD 08/11/18 1615

## 2018-08-09 NOTE — Discharge Instructions (Addendum)
Your symptoms are likely consistent with a viral illness. Viruses do not require or respond to antibiotics. Treatment is symptomatic care and it is important to note that these symptoms may last for 7-14 days.   Hand washing: Wash your hands throughout the day, but especially before and after touching the face, using the restroom, sneezing, coughing, or touching surfaces that have been coughed or sneezed upon. Hydration: Symptoms of most illnesses will be intensified and complicated by dehydration. Dehydration can also extend the duration of symptoms. Drink plenty of fluids and get plenty of rest. You should be drinking at least half a liter of water an hour to stay hydrated. Electrolyte drinks (ex. Gatorade, Powerade, Pedialyte) are also encouraged. You should be drinking enough fluids to make your urine light yellow, almost clear. If this is not the case, you are not drinking enough water. Please note that some of the treatments indicated below will not be effective if you are not adequately hydrated. Diet: Please concentrate on hydration, however, you may introduce food slowly.  Start with a clear liquid diet, progressed to a full liquid diet, and then bland solids as you are able. Pain or fever: Ibuprofen, Naproxen, or acetaminophen (generic for Tylenol) for pain or fever.  Antiinflammatory medications: Take 600 mg of ibuprofen every 6 hours or 440 mg (over the counter dose) to 500 mg (prescription dose) of naproxen every 12 hours for the next 3 days. After this time, these medications may be used as needed for pain. Take these medications with food to avoid upset stomach. Choose only one of these medications, do not take them together. Acetaminophen (generic for Tylenol): Should you continue to have additional pain while taking the ibuprofen or naproxen, you may add in acetaminophen as needed. Your daily total maximum amount of acetaminophen from all sources should be limited to 4000mg /day for persons  without liver problems, or 2000mg /day for those with liver problems. Nausea/vomiting: Use the ondansetron (generic for Zofran) for nausea or vomiting.  This medication may not prevent all vomiting or nausea, but can help facilitate better hydration. Things that can help with nausea/vomiting also include peppermint/menthol candies, vitamin B12, and ginger. Diarrhea: May use medications such as loperamide (Imodium) or Bismuth subsalicylate (Pepto-Bismol). Cough: Use the benzonatate (generic for Tessalon) for cough.  Teas, warm liquids, broths, and honey can also help with cough. Albuterol: May use the albuterol as needed for instances of shortness of breath. Prednisone: Take the prednisone, as directed, in its entirety. Zyrtec or Claritin: May add these medication daily to control underlying symptoms of congestion, sneezing, and other signs of allergies.  These medications are available over-the-counter. Generics: Cetirizine (generic for Zyrtec) and loratadine (generic for Claritin). Fluticasone: Use fluticasone (generic for Flonase), as directed, for nasal and sinus congestion.  This medication is available over-the-counter. Congestion: Plain guaifenesin (generic for plain Mucinex) may help relieve congestion. Saline sinus rinses and saline nasal sprays may also help relieve congestion. If you do not have high blood pressure, heart problems, or an allergy to such medications, you may also try phenylephrine or Sudafed. Sore throat: Warm liquids or Chloraseptic spray may help soothe a sore throat. Gargle twice a day with a salt water solution made from a half teaspoon of salt in a cup of warm water.  Follow up: Follow up with a primary care provider within the next two weeks should symptoms fail to resolve. Return: Return to the ED for significantly worsening symptoms, shortness of breath, persistent vomiting, large amounts of blood  in stool, or any other major concerns.  For prescription assistance,  may try using prescription discount sites or apps, such as goodrx.com

## 2018-08-09 NOTE — ED Triage Notes (Signed)
Pt arrives with c/o cough, "feeling feverish" and headache for about 1 week. Productive yellow sputum. Has been taking nyquil without relief.

## 2019-02-05 ENCOUNTER — Other Ambulatory Visit: Payer: Self-pay

## 2019-02-05 ENCOUNTER — Encounter (HOSPITAL_BASED_OUTPATIENT_CLINIC_OR_DEPARTMENT_OTHER): Payer: Self-pay

## 2019-02-05 DIAGNOSIS — Y929 Unspecified place or not applicable: Secondary | ICD-10-CM | POA: Insufficient documentation

## 2019-02-05 DIAGNOSIS — F17210 Nicotine dependence, cigarettes, uncomplicated: Secondary | ICD-10-CM | POA: Insufficient documentation

## 2019-02-05 DIAGNOSIS — W2209XA Striking against other stationary object, initial encounter: Secondary | ICD-10-CM | POA: Insufficient documentation

## 2019-02-05 DIAGNOSIS — Y9389 Activity, other specified: Secondary | ICD-10-CM | POA: Insufficient documentation

## 2019-02-05 DIAGNOSIS — M79641 Pain in right hand: Secondary | ICD-10-CM | POA: Insufficient documentation

## 2019-02-05 DIAGNOSIS — S60512A Abrasion of left hand, initial encounter: Secondary | ICD-10-CM | POA: Diagnosis not present

## 2019-02-05 DIAGNOSIS — S62327A Displaced fracture of shaft of fifth metacarpal bone, left hand, initial encounter for closed fracture: Secondary | ICD-10-CM | POA: Diagnosis not present

## 2019-02-05 DIAGNOSIS — Y999 Unspecified external cause status: Secondary | ICD-10-CM | POA: Insufficient documentation

## 2019-02-05 DIAGNOSIS — Z79899 Other long term (current) drug therapy: Secondary | ICD-10-CM | POA: Insufficient documentation

## 2019-02-05 DIAGNOSIS — S6992XA Unspecified injury of left wrist, hand and finger(s), initial encounter: Secondary | ICD-10-CM | POA: Diagnosis present

## 2019-02-05 NOTE — ED Triage Notes (Signed)
Pt c/o L hand injury. Obvious swelling noted in triage. Pt states he got mad and punched a wall.

## 2019-02-06 ENCOUNTER — Emergency Department (HOSPITAL_BASED_OUTPATIENT_CLINIC_OR_DEPARTMENT_OTHER): Payer: BC Managed Care – PPO

## 2019-02-06 ENCOUNTER — Emergency Department (HOSPITAL_BASED_OUTPATIENT_CLINIC_OR_DEPARTMENT_OTHER)
Admission: EM | Admit: 2019-02-06 | Discharge: 2019-02-06 | Disposition: A | Discharge status: Home / Self Care | Payer: BC Managed Care – PPO | Attending: Emergency Medicine | Admitting: Emergency Medicine

## 2019-02-06 DIAGNOSIS — S62339A Displaced fracture of neck of unspecified metacarpal bone, initial encounter for closed fracture: Secondary | ICD-10-CM

## 2019-02-06 MED ORDER — OXYCODONE-ACETAMINOPHEN 5-325 MG PO TABS
1.0000 | ORAL_TABLET | Freq: Once | ORAL | Status: AC
  Administered 2019-02-06: 1 via ORAL
  Filled 2019-02-06: qty 1

## 2019-02-06 MED ORDER — HYDROCODONE-ACETAMINOPHEN 5-325 MG PO TABS: 1.0000 | ORAL_TABLET | Freq: Four times a day (QID) | ORAL | 0 refills | Status: AC | PRN

## 2019-02-06 NOTE — ED Provider Notes (Signed)
[facility] EMERGENCY DEPARTMENT Provider Note   CSN: [id number] Arrival date & time: 02/05/19  2353     History   Chief Complaint Chief Complaint  Patient presents with  . Hand Injury    HPI David Gould is a 28 y.o. male.     HPI  This is a 28 year old male who presents with injury to the left hand.  Patient reports that he punched a wall multiple times with bilateral hands.  He is having pain and swelling to the left hand.  He also has some mild pain to the right hand.  He is left-handed.  He denies any other injury.  He rates his pain at 8 out of 10.  He has not taken anything for pain.  This happened approximately 4 hours prior to arrival.  Denies numbness or tingling of the hand.  History reviewed. No pertinent past medical history.  There are no active problems to display for this patient.   History reviewed. No pertinent surgical history.      Home Medications    Prior to Admission medications   Medication Sig Start Date End Date Taking? Authorizing Provider  acetaminophen (TYLENOL) 500 MG tablet Take 1 tablet (500 mg total) by mouth every 6 (six) hours as needed. 03/12/17   Fawze, Mina A, PA-C  albuterol (PROVENTIL HFA;VENTOLIN HFA) 108 (90 Base) MCG/ACT inhaler Inhale 2 puffs into the lungs every 4 (four) hours as needed for wheezing or shortness of breath. 08/09/18   Joy, Shawn C, PA-C  benzonatate (TESSALON) 100 MG capsule Take 1 capsule (100 mg total) by mouth every 8 (eight) hours. 08/09/18   Joy, Shawn C, PA-C  cyclobenzaprine (FLEXERIL) 10 MG tablet Take 1 tablet (10 mg total) by mouth 2 (two) times daily as needed for muscle spasms. 03/12/17   Fawze, Mina A, PA-C  fluticasone (FLONASE) 50 MCG/ACT nasal spray Place 2 sprays into both nostrils daily. 08/09/18   Joy, Shawn C, PA-C  HYDROcodone-acetaminophen (NORCO/VICODIN) 5-325 MG tablet Take 1 tablet by mouth every 6 (six) hours as needed. 02/06/19   Horton, Barbette Hair, MD  ibuprofen (ADVIL,MOTRIN) 600 MG  tablet Take 1 tablet (600 mg total) by mouth every 6 (six) hours as needed. 03/12/17   Fawze, Mina A, PA-C  ondansetron (ZOFRAN ODT) 4 MG disintegrating tablet Take 1 tablet (4 mg total) by mouth every 8 (eight) hours as needed for nausea or vomiting. 08/09/18   Joy, Shawn C, PA-C  oxymetazoline (AFRIN NASAL SPRAY) 0.05 % nasal spray Instill 2 to 3 sprays into each nostril twice daily for no more than 2 days (maximum dose: 2 doses/24 hours) 01/25/17   Rodell Perna A, PA-C  predniSONE (STERAPRED UNI-PAK 21 TAB) 10 MG (21) TBPK tablet Take 6 tabs (29m) day 1, 5 tabs (572m day 2, 4 tabs (402mday 3, 3 tabs (54m9may 4, 2 tabs (20mg95my 5, and 1 tab (10mg)18m 6. 08/09/18   Joy, Shawn [name]    Family History No family history on file.  Social History Social History   Tobacco Use  . Smoking status: Current Every Day Smoker    Types: Cigarettes  . Smokeless tobacco: Never Used  Substance Use Topics  . Alcohol use: Yes    Comment: weekly  . Drug use: Yes    Types: Marijuana     Allergies   Patient has no known allergies.   Review of Systems Review of Systems  Musculoskeletal:       Bilateral  hand pain  Neurological: Negative for weakness and numbness.  All other systems reviewed and are negative.    Physical Exam Updated Vital Signs BP 139/82 (BP Location: Right Arm)   Pulse (!) 110   Temp 99.8 F (37.7 C) (Oral)   Resp 20   Ht 1.753 m (5' 9" )   Wt 72.5 kg   SpO2 98%   BMI 20.67 kg/m   Physical Exam Vitals signs and nursing note reviewed.  Constitutional:      Appearance: Normal appearance. He is well-developed.  HENT:     Head: Normocephalic and atraumatic.  Neck:     Musculoskeletal: Neck supple.  Cardiovascular:     Rate and Rhythm: Normal rate and regular rhythm.  Pulmonary:     Effort: Pulmonary effort is normal. No respiratory distress.  Musculoskeletal:     Comments: Tenderness to palpation with swelling and obvious deformity over the left fifth  metacarpal, there is also swelling over the right fifth metacarpal, no significant tenderness or deformity, neurovascular intact bilaterally  Skin:    General: Skin is warm and dry.     Comments: Abrasion noted over the fifth and third knuckles, superficial  Neurological:     Mental Status: He is alert and oriented to person, place, and time.  Psychiatric:        Mood and Affect: Mood normal.      ED Treatments / Results  Labs (all labs ordered are listed, but only abnormal results are displayed) Labs Reviewed - No data to display  EKG None  Radiology Dg Hand Complete Left  Result Date: 02/06/2019 CLINICAL DATA:  Left hand pain and swelling after punching wall. EXAM: LEFT HAND - COMPLETE 3+ VIEW COMPARISON:  None. FINDINGS: Mildly comminuted displaced and angulated distal fifth metacarpal shaft fracture. No intra-articular extension. No other fracture. Left finger held in flexion at the proximal interphalangeal joint, likely positioning. Soft tissue edema noted at the fracture site. IMPRESSION: Mildly comminuted displaced and angulated distal fifth metacarpal shaft fracture. Electronically Signed   By: Keith Rake M.D.   On: 02/06/2019 00:19   Dg Hand Complete Right  Result Date: 02/06/2019 CLINICAL DATA:  Initial evaluation for acute trauma, swelling. EXAM: RIGHT HAND - COMPLETE 3+ VIEW COMPARISON:  None. FINDINGS: There is no evidence of fracture or dislocation. There is no evidence of arthropathy or other focal bone abnormality. Mild soft tissue swelling seen at the dorsal and ulnar aspect of the right hand. IMPRESSION: 1. No acute osseous abnormality. 2. Mild soft tissue swelling at the dorsal and ulnar aspect of the right hand. Electronically Signed   By: Jeannine Boga M.D.   On: 02/06/2019 01:30    Procedures Procedures (including critical care time)  Medications Ordered in ED Medications  oxyCODONE-acetaminophen (PERCOCET/ROXICET) 5-325 MG per tablet 1 tablet (1  tablet Oral Given 02/06/19 0037)     Initial Impression / Assessment and Plan / ED Course  I have reviewed the triage vital signs and the nursing notes.  Pertinent labs & imaging results that were available during my care of the patient were reviewed by me and considered in my medical decision making (see chart for details).        Patient presents with injury to the left hand.  Also some pain to the right hand.  Reports that he punched a wall.  Denies punching anyone in the face or fight bite.  He does have 2 superficial abrasions on his left hand.  These were cleaned and  dressed.  X-rays concerning for boxer's fracture.  He was placed in an ulnar gutter splint.  Appears closed.  Right x-rays are reviewed and negative.  Follow-up with Dr. Percell Miller recommended.  After history, exam, and medical workup I feel the patient has been appropriately medically screened and is safe for discharge home. Pertinent diagnoses were discussed with the patient. Patient was given return precautions.   Final Clinical Impressions(s) / ED Diagnoses   Final diagnoses:  Closed boxer's fracture, initial encounter    ED Discharge Orders         Ordered    HYDROcodone-acetaminophen (NORCO/VICODIN) 5-325 MG tablet  Every 6 hours PRN     02/06/19 0136           Merryl Hacker, MD 02/06/19 0150

## 2019-02-06 NOTE — ED Notes (Signed)
EMT at bedside applying splint 

## 2019-02-06 NOTE — Discharge Instructions (Signed)
Your seen today and have a fracture of your left hand.  Your x-rays of your right hand are negative.  Keep splint in place.  Take pain medication as prescribed.  Follow-up with Dr. Percell Miller.  Call his office for next available appointment.

## 2019-09-04 ENCOUNTER — Other Ambulatory Visit: Payer: Self-pay

## 2019-09-04 ENCOUNTER — Emergency Department (HOSPITAL_BASED_OUTPATIENT_CLINIC_OR_DEPARTMENT_OTHER): Payer: BC Managed Care – PPO

## 2019-09-04 ENCOUNTER — Encounter (HOSPITAL_BASED_OUTPATIENT_CLINIC_OR_DEPARTMENT_OTHER): Payer: Self-pay | Admitting: Emergency Medicine

## 2019-09-04 ENCOUNTER — Emergency Department (HOSPITAL_BASED_OUTPATIENT_CLINIC_OR_DEPARTMENT_OTHER)
Admission: EM | Admit: 2019-09-04 | Discharge: 2019-09-04 | Disposition: A | Discharge status: Home / Self Care | Payer: BC Managed Care – PPO | Attending: Emergency Medicine | Admitting: Emergency Medicine

## 2019-09-04 DIAGNOSIS — R61 Generalized hyperhidrosis: Secondary | ICD-10-CM | POA: Insufficient documentation

## 2019-09-04 DIAGNOSIS — R0602 Shortness of breath: Secondary | ICD-10-CM | POA: Insufficient documentation

## 2019-09-04 DIAGNOSIS — R110 Nausea: Secondary | ICD-10-CM | POA: Insufficient documentation

## 2019-09-04 DIAGNOSIS — Z79899 Other long term (current) drug therapy: Secondary | ICD-10-CM | POA: Diagnosis not present

## 2019-09-04 DIAGNOSIS — F17210 Nicotine dependence, cigarettes, uncomplicated: Secondary | ICD-10-CM | POA: Insufficient documentation

## 2019-09-04 DIAGNOSIS — R42 Dizziness and giddiness: Secondary | ICD-10-CM | POA: Diagnosis not present

## 2019-09-04 DIAGNOSIS — R0789 Other chest pain: Secondary | ICD-10-CM | POA: Diagnosis not present

## 2019-09-04 DIAGNOSIS — R05 Cough: Secondary | ICD-10-CM | POA: Diagnosis not present

## 2019-09-04 DIAGNOSIS — R079 Chest pain, unspecified: Secondary | ICD-10-CM

## 2019-09-04 DIAGNOSIS — R12 Heartburn: Secondary | ICD-10-CM | POA: Diagnosis not present

## 2019-09-04 LAB — CBC
HCT: 44.1 % (ref 39.0–52.0)
Hemoglobin: 15.9 g/dL (ref 13.0–17.0)
MCH: 32.4 pg (ref 26.0–34.0)
MCHC: 36.1 g/dL — ABNORMAL HIGH (ref 30.0–36.0)
MCV: 89.8 fL (ref 80.0–100.0)
Platelets: 306 10*3/uL (ref 150–400)
RBC: 4.91 MIL/uL (ref 4.22–5.81)
RDW: 12.6 % (ref 11.5–15.5)
WBC: 8.6 10*3/uL (ref 4.0–10.5)
nRBC: 0 % (ref 0.0–0.2)

## 2019-09-04 LAB — BASIC METABOLIC PANEL
Anion gap: 11 (ref 5–15)
BUN: 7 mg/dL (ref 6–20)
CO2: 25 mmol/L (ref 22–32)
Calcium: 9.1 mg/dL (ref 8.9–10.3)
Chloride: 107 mmol/L (ref 98–111)
Creatinine, Ser: 0.8 mg/dL (ref 0.61–1.24)
GFR calc Af Amer: >60 mL/min (ref >60)
GFR calc non Af Amer: >60 mL/min (ref >60)
Glucose, Bld: 103 mg/dL — ABNORMAL HIGH (ref 70–99)
Potassium: 3.9 mmol/L (ref 3.5–5.1)
Sodium: 143 mmol/L (ref 135–145)

## 2019-09-04 LAB — TROPONIN I (HIGH SENSITIVITY): Troponin I (High Sensitivity): <2 ng/L (ref <18)

## 2019-09-04 MED ORDER — FAMOTIDINE 20 MG PO TABS: 20.0000 mg | ORAL_TABLET | Freq: Two times a day (BID) | ORAL | 0 refills | Status: AC

## 2019-09-04 NOTE — ED Provider Notes (Signed)
[facility] EMERGENCY DEPARTMENT Provider Note   CSN: [id number] Arrival date & time: 09/04/19  5732     History Chief Complaint  Patient presents with  . Chest Pain    David Gould is a 29 y.o. male.  He is complaining of sharp and pressure left-sided chest pain has been going on intermittently for 3 weeks.  Sometimes it wakes him up at night.  Said the pain can last minutes up to an hour.  In between he feels totally fine.  Associated with chronic cough, some shortness of breath at times, dizziness, heartburn.  Denies any cocaine use.  No prior history of cardiac disease.  The history is provided by the patient.  Chest Pain Pain location:  L chest Pain quality: pressure and sharp   Pain radiates to:  Upper back Pain severity:  Moderate Onset quality:  Gradual Timing:  Intermittent Progression:  Unchanged Chronicity:  New Context: at rest   Relieved by:  Nothing Worsened by:  Nothing Ineffective treatments:  None tried Associated symptoms: cough, diaphoresis, dizziness, heartburn, nausea and shortness of breath   Associated symptoms: no abdominal pain, no fever and no vomiting   Risk factors: male sex and smoking   Risk factors: no coronary artery disease and no diabetes mellitus     HPI: A 29 year old patient presents for evaluation of chest pain. Initial onset of pain was approximately 1-3 hours ago. The patient's chest pain is described as heaviness/pressure/tightness, is sharp and is not worse with exertion. The patient complains of nausea and reports some diaphoresis. The patient's chest pain is middle- or left-sided, is not well-localized and does not radiate to the arms/jaw/neck. The patient has smoked in the past 90 days. The patient has no history of stroke, has no history of peripheral artery disease, denies any history of treated diabetes, has no relevant family history of coronary artery disease (first degree relative at less than age 32), is not  hypertensive, has no history of hypercholesterolemia and does not have an elevated BMI (>=30).   History reviewed. No pertinent past medical history.  There are no problems to display for this patient.   History reviewed. No pertinent surgical history.     No family history on file.  Social History   Tobacco Use  . Smoking status: Current Every Day Smoker    Types: Cigarettes  . Smokeless tobacco: Never Used  Substance Use Topics  . Alcohol use: Yes    Comment: weekly  . Drug use: Yes    Types: Marijuana    Home Medications Prior to Admission medications   Medication Sig Start Date End Date Taking? Authorizing Provider  cyclobenzaprine (FLEXERIL) 10 MG tablet Take 1 tablet (10 mg total) by mouth 2 (two) times daily as needed for muscle spasms. 03/12/17  Yes Fawze, Mina A, PA-C  acetaminophen (TYLENOL) 500 MG tablet Take 1 tablet (500 mg total) by mouth every 6 (six) hours as needed. 03/12/17   Fawze, Mina A, PA-C  albuterol (PROVENTIL HFA;VENTOLIN HFA) 108 (90 Base) MCG/ACT inhaler Inhale 2 puffs into the lungs every 4 (four) hours as needed for wheezing or shortness of breath. 08/09/18   Joy, Shawn C, PA-C  benzonatate (TESSALON) 100 MG capsule Take 1 capsule (100 mg total) by mouth every 8 (eight) hours. 08/09/18   Joy, Shawn C, PA-C  fluticasone (FLONASE) 50 MCG/ACT nasal spray Place 2 sprays into both nostrils daily. 08/09/18   Joy, Shawn C, PA-C  HYDROcodone-acetaminophen (NORCO/VICODIN) 5-325 MG tablet Take  1 tablet by mouth every 6 (six) hours as needed. 02/06/19   Horton, Barbette Hair, MD  ibuprofen (ADVIL,MOTRIN) 600 MG tablet Take 1 tablet (600 mg total) by mouth every 6 (six) hours as needed. 03/12/17   Fawze, Mina A, PA-C  ondansetron (ZOFRAN ODT) 4 MG disintegrating tablet Take 1 tablet (4 mg total) by mouth every 8 (eight) hours as needed for nausea or vomiting. 08/09/18   Joy, Shawn C, PA-C  oxymetazoline (AFRIN NASAL SPRAY) 0.05 % nasal spray Instill 2 to 3 sprays into each  nostril twice daily for no more than 2 days (maximum dose: 2 doses/24 hours) 01/25/17   Rodell Perna A, PA-C  predniSONE (STERAPRED UNI-PAK 21 TAB) 10 MG (21) TBPK tablet Take 6 tabs (46m) day 1, 5 tabs (566m day 2, 4 tabs (4067mday 3, 3 tabs (48m70may 4, 2 tabs (20mg54my 5, and 1 tab (10mg)7m 6. 08/09/18   Joy, Shawn C, PA-C    Allergies    Patient has no known allergies.  Review of Systems   Review of Systems  Constitutional: Positive for diaphoresis. Negative for fever.  HENT: Negative for sore throat.   Eyes: Negative for visual disturbance.  Respiratory: Positive for cough and shortness of breath.   Cardiovascular: Positive for chest pain.  Gastrointestinal: Positive for heartburn and nausea. Negative for abdominal pain and vomiting.  Genitourinary: Negative for dysuria.  Musculoskeletal: Negative for neck pain.  Skin: Negative for rash.  Neurological: Positive for dizziness.    Physical Exam Updated Vital Signs BP 134/85 (BP Location: Right Arm)   Pulse 95   Temp 98 F (36.7 C) (Oral)   Resp 20   Ht 5' 9"  (1.753 m)   Wt 61.2 kg   SpO2 100%   BMI 19.94 kg/m   Physical Exam Vitals and nursing note reviewed.  Constitutional:      Appearance: He is well-developed.  HENT:     Head: Normocephalic and atraumatic.  Eyes:     Conjunctiva/sclera: Conjunctivae normal.  Cardiovascular:     Rate and Rhythm: Normal rate and regular rhythm.     Heart sounds: Normal heart sounds. No murmur.  Pulmonary:     Effort: Pulmonary effort is normal. No respiratory distress.     Breath sounds: Normal breath sounds.  Abdominal:     Palpations: Abdomen is soft.     Tenderness: There is no abdominal tenderness.  Musculoskeletal:        General: Normal range of motion.     Cervical back: Neck supple.     Right lower leg: No tenderness.     Left lower leg: No tenderness.  Skin:    General: Skin is warm and dry.     Capillary Refill: Capillary refill takes less than 2 seconds.    Neurological:     General: No focal deficit present.     Mental Status: He is alert.     GCS: GCS eye subscore is 4. GCS verbal subscore is 5. GCS motor subscore is 6.     ED Results / Procedures / Treatments   Labs (all labs ordered are listed, but only abnormal results are displayed) Labs Reviewed  BASIC METABOLIC PANEL - Abnormal; Notable for the following components:      Result Value   Glucose, Bld 103 (*)    All other components within normal limits  CBC - Abnormal; Notable for the following components:   MCHC 36.1 (*)    All other components  within normal limits  TROPONIN I (HIGH SENSITIVITY)  TROPONIN I (HIGH SENSITIVITY)    EKG EKG Interpretation  Date/Time:  Monday September 04 2019 06:27:54 EDT Ventricular Rate:  102 PR Interval:    QRS Duration: 86 QT Interval:  340 QTC Calculation: 443 R Axis:   72 Text Interpretation: Sinus tachycardia Baseline wander in lead(s) III Confirmed by Aletta Edouard [phone]) on 09/04/2019 6:54:32 AM   Radiology DG Chest 2 View  Result Date: 09/04/2019 CLINICAL DATA:  Chest pain and shortness of breath for 3 weeks. History of smoking. EXAM: CHEST - 2 VIEW COMPARISON:  Chest x-ray 08/09/2018 FINDINGS: The cardiac silhouette, mediastinal and hilar contours are within normal limits. Mild peribronchial thickening and slightly accentuated interstitial markings could be due to bronchitis or smoking changes. No infiltrates, edema or effusions. No pulmonary lesions. The bony thorax is intact. IMPRESSION: Mild bronchitic changes could be due to bronchitis or smoking. No infiltrates or effusions. Electronically Signed   By: Marijo Sanes M.D.   On: 09/04/2019 07:21    Procedures Procedures (including critical care time)  Medications Ordered in ED Medications - No data to display  ED Course  I have reviewed the triage vital signs and the nursing notes.  Pertinent labs & imaging results that were available during my care of the patient were  reviewed by me and considered in my medical decision making (see chart for details).  Clinical Course as of Sep 04 1755  Mon Sep 04, 2019  0716 Differential includes reflux, musculoskeletal, ACS, pneumonia, pneumothorax, vascular   [MB]  0725 Chest x-ray interpreted by me as no infiltrate no pneumothorax normal mediastinum.   [MB]  [id number] Patient is PERC negative.   [MB]  0732 Patient's troponin less than 2 chest x-ray and EKG unremarkable.  Otherwise lab work unremarkable.  Patient states he has 0 out of 10 pain right now.   [MB]  [phone] Due to the atypical nature of his symptoms I do not think he needs a delta troponin.  His initial 1 was less than 2.  I reviewed all this with the patient.  He is comfortable with discharge home we will put her on an antacid.  He understands to return if any worsening of his symptoms.   [MB]    Clinical Course User Index [MB] Hayden Rasmussen, MD   MDM Rules/Calculators/A&P HEAR Score: 3                     Final Clinical Impression(s) / ED Diagnoses Final diagnoses:  Nonspecific chest pain    Rx / DC Orders ED Discharge Orders    None       Hayden Rasmussen, MD 09/04/19 1757

## 2019-09-04 NOTE — Discharge Instructions (Addendum)
You were seen in the emergency department for evaluation of 3 weeks of intermittent chest pain.  You had blood work EKG and a chest x-ray that did not show any serious findings.  We are prescribing you acid medication for your stomach which may help your symptoms.  Please return to the emergency department for any worsening or concerning symptoms.

## 2019-09-04 NOTE — ED Triage Notes (Signed)
Pt reports having chest pain that wakes him up/keeps him up for three weeks, pain lasts 2-3 minutes and happens occasionally throughout the day, pt reports SOB and N/V, pt not taking any medication, smokes regularly

## 2019-11-14 ENCOUNTER — Encounter (HOSPITAL_BASED_OUTPATIENT_CLINIC_OR_DEPARTMENT_OTHER): Payer: Self-pay

## 2019-11-14 ENCOUNTER — Emergency Department (HOSPITAL_BASED_OUTPATIENT_CLINIC_OR_DEPARTMENT_OTHER): Payer: Self-pay

## 2019-11-14 ENCOUNTER — Other Ambulatory Visit: Payer: Self-pay

## 2019-11-14 ENCOUNTER — Emergency Department (HOSPITAL_BASED_OUTPATIENT_CLINIC_OR_DEPARTMENT_OTHER)
Admission: EM | Admit: 2019-11-14 | Discharge: 2019-11-14 | Disposition: A | Discharge status: Home / Self Care | Payer: BC Managed Care – PPO | Attending: Emergency Medicine | Admitting: Emergency Medicine

## 2019-11-14 DIAGNOSIS — F17210 Nicotine dependence, cigarettes, uncomplicated: Secondary | ICD-10-CM | POA: Insufficient documentation

## 2019-11-14 DIAGNOSIS — Z7982 Long term (current) use of aspirin: Secondary | ICD-10-CM | POA: Insufficient documentation

## 2019-11-14 DIAGNOSIS — M79642 Pain in left hand: Secondary | ICD-10-CM

## 2019-11-14 MED ORDER — IBUPROFEN 600 MG PO TABS
600.0000 mg | ORAL_TABLET | Freq: Four times a day (QID) | ORAL | 0 refills | Status: AC | PRN
Start: 2019-11-14 — End: ?

## 2019-11-14 MED ORDER — IBUPROFEN 600 MG PO TABS
600.0000 mg | ORAL_TABLET | Freq: Four times a day (QID) | ORAL | 0 refills | Status: DC | PRN
Start: 2019-11-14 — End: 2019-11-14

## 2019-11-14 NOTE — ED Notes (Signed)
Patient ambulated to X-ray

## 2019-11-14 NOTE — Discharge Instructions (Signed)
You may alternate taking Tylenol and Ibuprofen as needed for pain control. You may take 400-600 mg of ibuprofen every 6 hours and [phone] mg of Tylenol every 6 hours. Do not exceed 4000 mg of Tylenol daily as this can lead to liver damage. Also, make sure to take Ibuprofen with meals as it can cause an upset stomach. Do not take other NSAIDs while taking Ibuprofen such as (Aleve, Naprosyn, Aspirin, Celebrex, etc) and do not take more than the prescribed dose as this can lead to ulcers and bleeding in your GI tract. You may use warm and cold compresses to help with your symptoms.   Please follow up with your primary doctor or with the orthopedic doctor within the next 7-10 days for re-evaluation and further treatment of your symptoms.   Please return to the ER sooner if you have any new or worsening symptoms.

## 2019-11-14 NOTE — ED Triage Notes (Signed)
Pt c/o left hand pain x 1 week-no recent injury-NAD-steady gait

## 2019-11-14 NOTE — ED Notes (Signed)
Pharmacy and medications updated with patient

## 2019-11-14 NOTE — ED Provider Notes (Signed)
[facility] EMERGENCY DEPARTMENT Provider Note   CSN: [id number] Arrival date & time: 11/14/19  1657     History Chief Complaint  Patient presents with  . Hand Pain    David Gould is a 29 y.o. male.  HPI   29 year old male presenting for evaluation of left hand pain for the last 1 to 2 weeks.  Patient is left-handed.  States that he has a burning pain along the left side of the hand and in the pinky that radiates up the medial aspect of his arm.  The pain is constant in nature and is severe.  He denies any specific exacerbating or alleviating factors.  He denies any recent injuries.  States that he has had surgery on this hand in the past.  History reviewed. No pertinent past medical history.  There are no problems to display for this patient.   Past Surgical History:  Procedure Laterality Date  . HAND SURGERY         No family history on file.  Social History   Tobacco Use  . Smoking status: Current Every Day Smoker    Types: Cigarettes  . Smokeless tobacco: Never Used  Substance Use Topics  . Alcohol use: Yes    Comment: weekly  . Drug use: Yes    Types: Marijuana    Home Medications Prior to Admission medications   Medication Sig Start Date End Date Taking? Authorizing Provider  acetaminophen (TYLENOL) 500 MG tablet Take 1 tablet (500 mg total) by mouth every 6 (six) hours as needed. 03/12/17   Fawze, Mina A, PA-C  albuterol (PROVENTIL HFA;VENTOLIN HFA) 108 (90 Base) MCG/ACT inhaler Inhale 2 puffs into the lungs every 4 (four) hours as needed for wheezing or shortness of breath. 08/09/18   Joy, Shawn C, PA-C  benzonatate (TESSALON) 100 MG capsule Take 1 capsule (100 mg total) by mouth every 8 (eight) hours. 08/09/18   Joy, Shawn C, PA-C  cyclobenzaprine (FLEXERIL) 10 MG tablet Take 1 tablet (10 mg total) by mouth 2 (two) times daily as needed for muscle spasms. 03/12/17   Fawze, Mina A, PA-C  famotidine (PEPCID) 20 MG tablet Take 1 tablet (20 mg total) by  mouth 2 (two) times daily. 09/04/19   Hayden Rasmussen, MD  fluticasone (FLONASE) 50 MCG/ACT nasal spray Place 2 sprays into both nostrils daily. 08/09/18   Joy, Shawn C, PA-C  HYDROcodone-acetaminophen (NORCO/VICODIN) 5-325 MG tablet Take 1 tablet by mouth every 6 (six) hours as needed. 02/06/19   Horton, Barbette Hair, MD  ibuprofen (ADVIL) 600 MG tablet Take 1 tablet (600 mg total) by mouth every 6 (six) hours as needed. 11/14/19   Gen Clagg S, PA-C  ondansetron (ZOFRAN ODT) 4 MG disintegrating tablet Take 1 tablet (4 mg total) by mouth every 8 (eight) hours as needed for nausea or vomiting. 08/09/18   Joy, Shawn C, PA-C  oxymetazoline (AFRIN NASAL SPRAY) 0.05 % nasal spray Instill 2 to 3 sprays into each nostril twice daily for no more than 2 days (maximum dose: 2 doses/24 hours) 01/25/17   Rodell Perna A, PA-C  predniSONE (STERAPRED UNI-PAK 21 TAB) 10 MG (21) TBPK tablet Take 6 tabs (82m) day 1, 5 tabs (557m day 2, 4 tabs (4070mday 3, 3 tabs (5m2may 4, 2 tabs (20mg26my 5, and 1 tab (10mg)16m 6. 08/09/18   Joy, Shawn C, PA-C    Allergies    Patient has no known allergies.  Review of Systems  Review of Systems  Constitutional: Negative for fever.  Musculoskeletal:       Burning pain to the left hand  Skin: Negative for color change and wound.  Neurological: Negative for weakness and numbness.    Physical Exam Updated Vital Signs BP 130/81 (BP Location: Right Arm)   Pulse (!) 108   Temp 98.5 F (36.9 C) (Oral)   Resp 16   SpO2 100%   Physical Exam Constitutional:      General: He is not in acute distress.    Appearance: He is well-developed.  Eyes:     Conjunctiva/sclera: Conjunctivae normal.  Cardiovascular:     Rate and Rhythm: Normal rate and regular rhythm.  Pulmonary:     Effort: Pulmonary effort is normal.     Breath sounds: Normal breath sounds.  Musculoskeletal:     Comments: TTP to the right 5th digit. Sensation intact. Brisk cap refill to all fingers. Normal ROM  and strength to the LUE. Radial/ulnar pulses intact. +tinnel sign with percussion of the ulnar nerve.   Skin:    General: Skin is warm and dry.  Neurological:     Mental Status: He is alert and oriented to person, place, and time.     ED Results / Procedures / Treatments   Labs (all labs ordered are listed, but only abnormal results are displayed) Labs Reviewed - No data to display  EKG None  Radiology DG Hand Complete Left  Result Date: 11/14/2019 CLINICAL DATA:  Atraumatic left hand pain x1 week. EXAM: LEFT HAND - COMPLETE 3+ VIEW COMPARISON:  None. FINDINGS: There is no evidence of fracture or dislocation. There is no evidence of arthropathy or other focal bone abnormality. Soft tissues are unremarkable. IMPRESSION: Negative. Electronically Signed   By: Virgina Norfolk M.D.   On: 11/14/2019 17:33    Procedures Procedures (including critical care time)  Medications Ordered in ED Medications - No data to display  ED Course  I have reviewed the triage vital signs and the nursing notes.  Pertinent labs & imaging results that were available during my care of the patient were reviewed by me and considered in my medical decision making (see chart for details).    MDM Rules/Calculators/A&P                      29 year old male presenting for evaluation of pain to the left fifth digit that started about a week ago.  Described as a burning sensation that radiates up the left forearm.  On exam patient neurovascularly intact.  X-ray reviewed/interpreted and negative for acute bony abnormality.  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 Will start pt on antiinflammatories and give work note.  We will also give Ortho follow-up should conservative therapy not resolve the patient's symptoms.  Advised on plan for follow-up and return precautions.  He voiced understanding plan reasons return.  All Questions answered, pt stable for  discharge.  Final Clinical Impression(s) / ED Diagnoses Final diagnoses:  Left hand pain    Rx / DC Orders ED Discharge Orders         Ordered    ibuprofen (ADVIL) 600 MG tablet  Every 6 hours PRN     11/14/19 1800           Teri Legacy S, PA-C 11/14/19 1813    Margette Fast, MD 11/14/19 [phone]

## 2020-10-25 ENCOUNTER — Encounter (HOSPITAL_BASED_OUTPATIENT_CLINIC_OR_DEPARTMENT_OTHER): Payer: Self-pay

## 2020-10-25 ENCOUNTER — Emergency Department (HOSPITAL_BASED_OUTPATIENT_CLINIC_OR_DEPARTMENT_OTHER)
Admission: EM | Admit: 2020-10-25 | Discharge: 2020-10-25 | Disposition: A | Discharge status: Home / Self Care | Payer: BC Managed Care – PPO | Attending: Emergency Medicine | Admitting: Emergency Medicine

## 2020-10-25 ENCOUNTER — Other Ambulatory Visit: Payer: Self-pay

## 2020-10-25 DIAGNOSIS — M545 Low back pain, unspecified: Secondary | ICD-10-CM | POA: Insufficient documentation

## 2020-10-25 DIAGNOSIS — F17210 Nicotine dependence, cigarettes, uncomplicated: Secondary | ICD-10-CM | POA: Diagnosis not present

## 2020-10-25 MED ORDER — LIDOCAINE 5 % EX PTCH
1.0000 | MEDICATED_PATCH | CUTANEOUS | Status: DC
  Administered 2020-10-25: 1 via TRANSDERMAL
  Filled 2020-10-25: qty 1

## 2020-10-25 MED ORDER — METHOCARBAMOL 500 MG PO TABS
500.0000 mg | ORAL_TABLET | Freq: Once | ORAL | Status: AC
  Administered 2020-10-25: 500 mg via ORAL
  Filled 2020-10-25: qty 1

## 2020-10-25 MED ORDER — METHOCARBAMOL 500 MG PO TABS
500.0000 mg | ORAL_TABLET | Freq: Two times a day (BID) | ORAL | 0 refills | Status: AC
Start: 2020-10-25 — End: ?

## 2020-10-25 MED ORDER — NAPROXEN 500 MG PO TABS
500.0000 mg | ORAL_TABLET | Freq: Two times a day (BID) | ORAL | 0 refills | Status: AC
Start: 2020-10-25 — End: ?

## 2020-10-25 MED ORDER — ACETAMINOPHEN 325 MG PO TABS
650.0000 mg | ORAL_TABLET | Freq: Once | ORAL | Status: AC
  Administered 2020-10-25: 650 mg via ORAL
  Filled 2020-10-25: qty 2

## 2020-10-25 NOTE — ED Triage Notes (Signed)
Pt c/o mid/lower back pain x 2 weeks after moving boxes-NAD-steady gait

## 2020-10-25 NOTE — ED Provider Notes (Signed)
[facility] EMERGENCY DEPARTMENT Provider Note   CSN: [id number] Arrival date & time: 10/25/20  1827     History Chief Complaint  Patient presents with  . Back Pain    David Gould is a 30 y.o. male with no significant past medical history who presents to the ED due to persistent low back pain x2 weeks.  Patient states he was moving boxes 2 weeks ago and has had persistent low back pain since.  Patient states back pain has focalized to left lumbar region.  Denies saddle paresthesias, bowel/bladder incontinence, lower extremity numbness/tingling, lower extremity weakness.  Denies fever and chills.  No history of IV drug use. He saw a chiropractor for his back pain who believed patient was experiencing back spasms per patient. No direct trauma. He has been taking ibuprofen with moderate relief. Pain is worse with ambulation.  No treatment prior to arrival.  History obtained from patient and past medical records. No interpreter used during encounter.       History reviewed. No pertinent past medical history.  There are no problems to display for this patient.   Past Surgical History:  Procedure Laterality Date  . HAND SURGERY         No family history on file.  Social History   Tobacco Use  . Smoking status: Current Every Day Smoker    Types: Cigarettes  . Smokeless tobacco: Never Used  Vaping Use  . Vaping Use: Never used  Substance Use Topics  . Alcohol use: Yes    Comment: occ  . Drug use: Yes    Types: Marijuana    Home Medications Prior to Admission medications   Medication Sig Start Date End Date Taking? Authorizing Provider  methocarbamol (ROBAXIN) 500 MG tablet Take 1 tablet (500 mg total) by mouth 2 (two) times daily. 10/25/20  Yes Petro Talent C, PA-C  naproxen (NAPROSYN) 500 MG tablet Take 1 tablet (500 mg total) by mouth 2 (two) times daily. 10/25/20  Yes Rykar Lebleu, Druscilla Brownie, PA-C  acetaminophen (TYLENOL) 500 MG tablet Take 1 tablet (500 mg  total) by mouth every 6 (six) hours as needed. 03/12/17   Fawze, Mina A, PA-C  albuterol (PROVENTIL HFA;VENTOLIN HFA) 108 (90 Base) MCG/ACT inhaler Inhale 2 puffs into the lungs every 4 (four) hours as needed for wheezing or shortness of breath. 08/09/18   Joy, Shawn C, PA-C  benzonatate (TESSALON) 100 MG capsule Take 1 capsule (100 mg total) by mouth every 8 (eight) hours. 08/09/18   Joy, Shawn C, PA-C  cyclobenzaprine (FLEXERIL) 10 MG tablet Take 1 tablet (10 mg total) by mouth 2 (two) times daily as needed for muscle spasms. 03/12/17   Fawze, Mina A, PA-C  famotidine (PEPCID) 20 MG tablet Take 1 tablet (20 mg total) by mouth 2 (two) times daily. 09/04/19   Hayden Rasmussen, MD  fluticasone (FLONASE) 50 MCG/ACT nasal spray Place 2 sprays into both nostrils daily. 08/09/18   Joy, Shawn C, PA-C  HYDROcodone-acetaminophen (NORCO/VICODIN) 5-325 MG tablet Take 1 tablet by mouth every 6 (six) hours as needed. 02/06/19   Horton, Barbette Hair, MD  ibuprofen (ADVIL) 600 MG tablet Take 1 tablet (600 mg total) by mouth every 6 (six) hours as needed. 11/14/19   Couture, Cortni S, PA-C  ondansetron (ZOFRAN ODT) 4 MG disintegrating tablet Take 1 tablet (4 mg total) by mouth every 8 (eight) hours as needed for nausea or vomiting. 08/09/18   Joy, Shawn C, PA-C  oxymetazoline (AFRIN NASAL SPRAY) 0.05 %  nasal spray Instill 2 to 3 sprays into each nostril twice daily for no more than 2 days (maximum dose: 2 doses/24 hours) 01/25/17   Rodell Perna A, PA-C  predniSONE (STERAPRED UNI-PAK 21 TAB) 10 MG (21) TBPK tablet Take 6 tabs (37m) day 1, 5 tabs (540m day 2, 4 tabs (4017mday 3, 3 tabs (17m17may 4, 2 tabs (20mg24my 5, and 1 tab (10mg)38m 6. 08/09/18   Joy, Shawn C, PA-C    Allergies    Patient has no known allergies.  Review of Systems   Review of Systems  Constitutional: Negative for chills and fever.  Respiratory: Negative for shortness of breath.   Cardiovascular: Negative for chest pain.  Gastrointestinal: Negative for  abdominal pain.  Genitourinary: Negative for difficulty urinating.  Musculoskeletal: Positive for back pain and gait problem.  Neurological: Negative for numbness.  All other systems reviewed and are negative.   Physical Exam Updated Vital Signs BP 134/90 (BP Location: Left Arm)   Pulse 91   Temp 98.8 F (37.1 C) (Oral)   Resp 18   Ht 5' 9"  (1.753 m)   Wt 61.2 kg   SpO2 100%   BMI 19.94 kg/m   Physical Exam Vitals and nursing note reviewed.  Constitutional:      General: He is not in acute distress.    Appearance: He is not ill-appearing.  HENT:     Head: Normocephalic.  Eyes:     Pupils: Pupils are equal, round, and reactive to light.  Neck:     Comments: No cervical midline tenderness. Cardiovascular:     Rate and Rhythm: Normal rate and regular rhythm.     Pulses: Normal pulses.     Heart sounds: Normal heart sounds. No murmur heard. No friction rub. No gallop.   Pulmonary:     Effort: Pulmonary effort is normal.     Breath sounds: Normal breath sounds.  Abdominal:     General: Abdomen is flat. There is no distension.     Palpations: Abdomen is soft.     Tenderness: There is no abdominal tenderness. There is no guarding or rebound.  Musculoskeletal:        General: Normal range of motion.     Cervical back: Neck supple.     Comments: No thoracic or lumbar midline tenderness.  Reproducible left-sided lumbar paraspinal tenderness.  Bilateral lower extremities neurovascularly intact.  Patient able to ambulate in the ED without difficulty.  Skin:    General: Skin is warm and dry.  Neurological:     General: No focal deficit present.     Mental Status: He is alert.  Psychiatric:        Mood and Affect: Mood normal.        Behavior: Behavior normal.     ED Results / Procedures / Treatments   Labs (all labs ordered are listed, but only abnormal results are displayed) Labs Reviewed - No data to display  EKG None  Radiology No results  found.  Procedures Procedures   Medications Ordered in ED Medications  lidocaine (LIDODERM) 5 % 1 patch (1 patch Transdermal Patch Applied 10/25/20 1957)  methocarbamol (ROBAXIN) tablet 500 mg (500 mg Oral Given 10/25/20 1957)  acetaminophen (TYLENOL) tablet 650 mg (650 mg Oral Given 10/25/20 1957)    ED Course  I have reviewed the triage vital signs and the nursing notes.  Pertinent labs & imaging results that were available during my care of the patient were reviewed  by me and considered in my medical decision making (see chart for details).    MDM Rules/Calculators/A&P                         30 year old male presents to the ED due to persistent back pain x2 weeks after lifting heavy boxes.  Patient has been evaluated by chiropractor who believed patient was experiencing muscle spasms per patient.  Patient denies saddle paresthesias, bowel/bladder incontinence, lower extremity numbness/tingling, lower extremity weakness, IV drug use, fever/chills, history of cancer.  Vitals all within normal limits.  Patient nontoxic-appearing.  Physical exam reassuring.  No cervical, thoracic, or lumbar midline tenderness.  Reproducible left-sided lumbar tenderness.  Bilateral lower extremities neurovascularly intact.  Patient able to ambulate in the ED without difficulty.  Low suspicion for cauda equina or central cord compression.  Patient treated with Robaxin and Tylenol with symptomatic relief.  Patient discharged with symptomatic treatment.  Low back exercises given to patient. Strict ED precautions discussed with patient. Patient states understanding and agrees to plan. Patient discharged home in no acute distress and stable vitals.  Final Clinical Impression(s) / ED Diagnoses Final diagnoses:  Acute bilateral low back pain without sciatica    Rx / DC Orders ED Discharge Orders         Ordered    methocarbamol (ROBAXIN) 500 MG tablet  2 times daily        10/25/20 2004    naproxen (NAPROSYN)  500 MG tablet  2 times daily        10/25/20 2004           Karie Kirks 10/25/20 2042    Arnaldo Natal, MD 10/25/20 [phone]

## 2020-10-25 NOTE — Discharge Instructions (Addendum)
As discussed, your low back pain can take up to 6 weeks to improve.  I have included low back exercises.  I am sending you home with pain medication and a muscle relaxer.  Muscle relaxer can cause drowsiness do not drive or operate machinery while on the medication.  You may also purchase over-the-counter Lidoderm patches and Voltaren gel for added pain relief. Please follow-up with PCP if symptoms do not improve within the next few weeks. Return to the ER for new or worsening symptoms.

## 2021-02-18 IMAGING — DX DG HAND COMPLETE 3+V*L*
1 series · 1 of 1 positions shown · non-contrast
Comparison: None.

CLINICAL DATA: Atraumatic left hand pain x1 week.

EXAM:
LEFT HAND - COMPLETE 3+ VIEW

[hand obl]
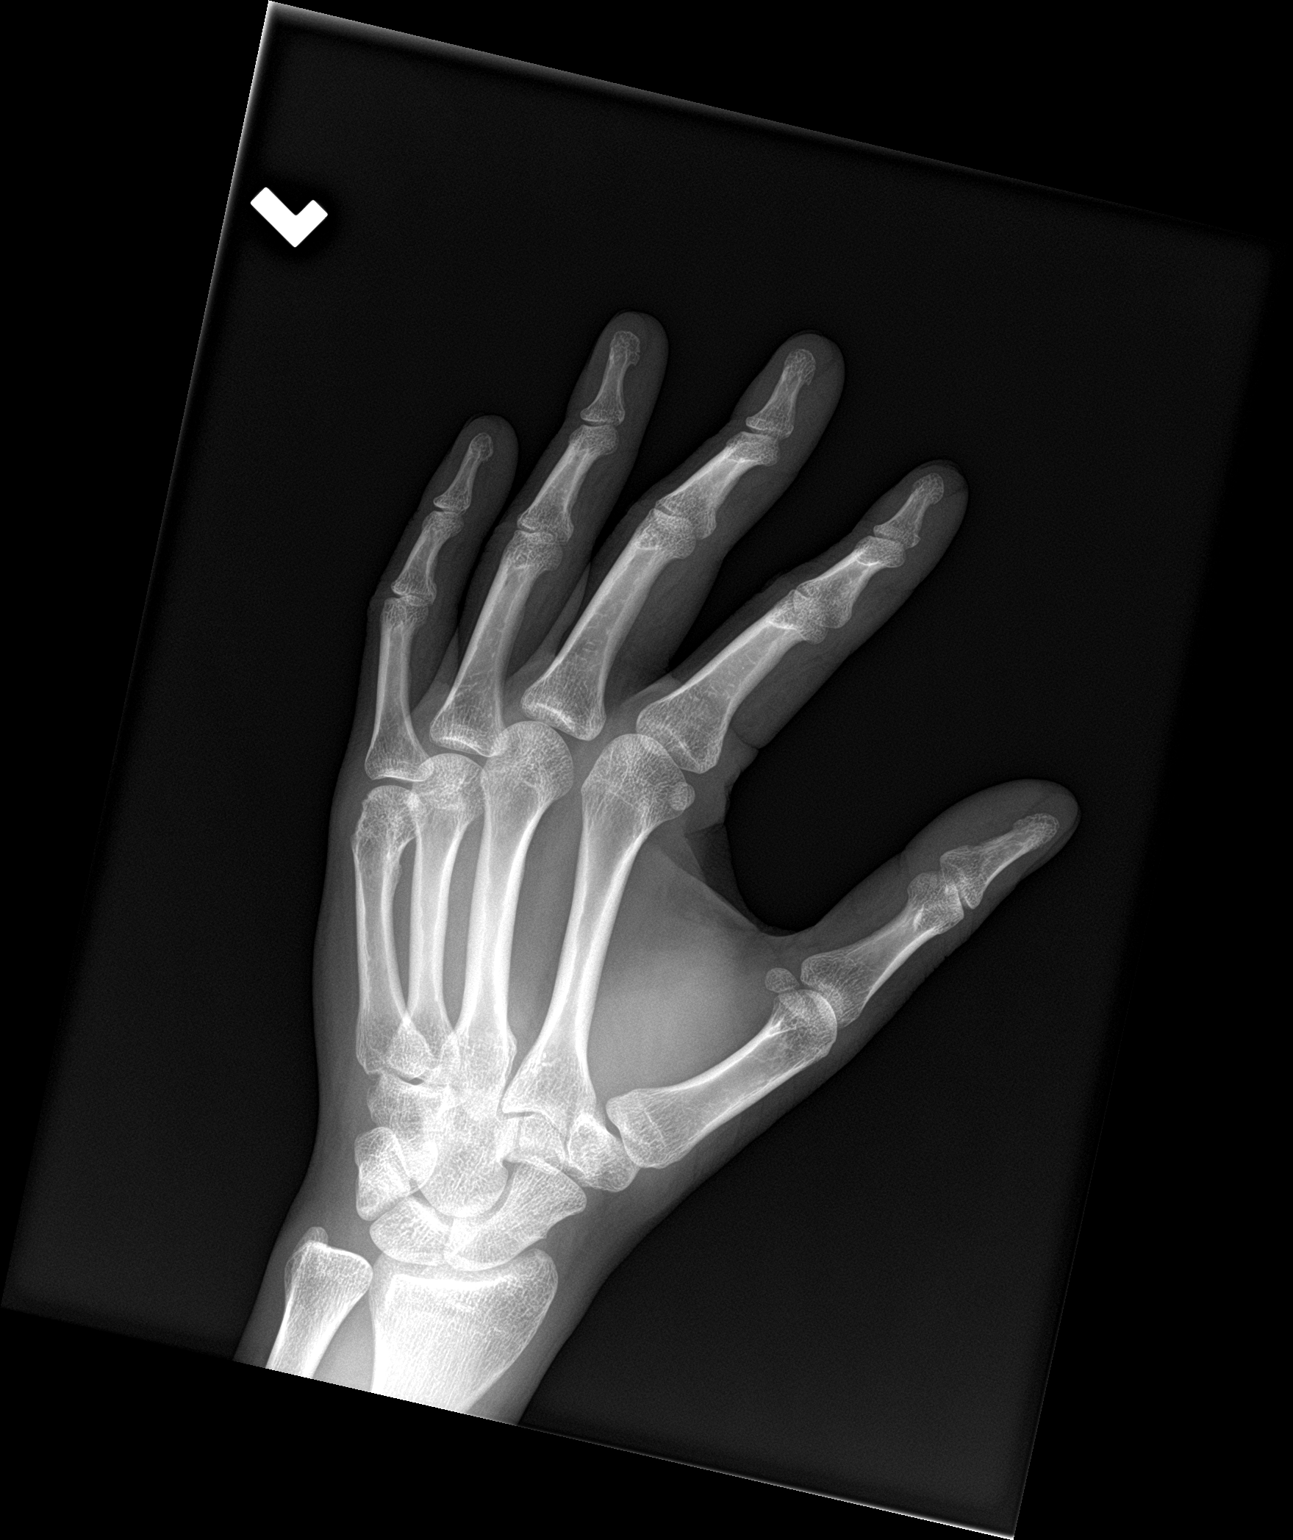

[1 of 1 positions shown; findings below may reference images not displayed]

FINDINGS: There is no evidence of fracture or dislocation. There is no
evidence of arthropathy or other focal bone abnormality. Soft
tissues are unremarkable.
IMPRESSION: Negative.

## 2022-08-23 ENCOUNTER — Encounter (HOSPITAL_COMMUNITY): Payer: Self-pay

## 2022-08-23 ENCOUNTER — Emergency Department (HOSPITAL_COMMUNITY)
Admission: EM | Admit: 2022-08-23 | Discharge: 2022-08-23 | Disposition: A | Discharge status: Home / Self Care | Payer: BC Managed Care – PPO | Attending: Emergency Medicine | Admitting: Emergency Medicine

## 2022-08-23 ENCOUNTER — Other Ambulatory Visit: Payer: Self-pay

## 2022-08-23 DIAGNOSIS — H9202 Otalgia, left ear: Secondary | ICD-10-CM | POA: Insufficient documentation

## 2022-08-23 DIAGNOSIS — F10920 Alcohol use, unspecified with intoxication, uncomplicated: Secondary | ICD-10-CM

## 2022-08-23 DIAGNOSIS — F10220 Alcohol dependence with intoxication, uncomplicated: Secondary | ICD-10-CM | POA: Insufficient documentation

## 2022-08-23 DIAGNOSIS — Y908 Blood alcohol level of 240 mg/100 ml or more: Secondary | ICD-10-CM | POA: Insufficient documentation

## 2022-08-23 LAB — COMPREHENSIVE METABOLIC PANEL
ALT: 63 U/L — ABNORMAL HIGH (ref 0–44)
AST: 75 U/L — ABNORMAL HIGH (ref 15–41)
Albumin: 4.2 g/dL (ref 3.5–5.0)
Alkaline Phosphatase: 58 U/L (ref 38–126)
Anion gap: 15 (ref 5–15)
BUN: <5 mg/dL — ABNORMAL LOW (ref 6–20)
CO2: 21 mmol/L — ABNORMAL LOW (ref 22–32)
Calcium: 9.1 mg/dL (ref 8.9–10.3)
Chloride: 105 mmol/L (ref 98–111)
Creatinine, Ser: 0.73 mg/dL (ref 0.61–1.24)
GFR, Estimated: >60 mL/min (ref >60)
Glucose, Bld: 76 mg/dL (ref 70–99)
Potassium: 4.3 mmol/L (ref 3.5–5.1)
Sodium: 141 mmol/L (ref 135–145)
Total Bilirubin: 0.4 mg/dL (ref 0.3–1.2)
Total Protein: 7.1 g/dL (ref 6.5–8.1)

## 2022-08-23 LAB — CBC WITH DIFFERENTIAL/PLATELET
Abs Immature Granulocytes: 0.02 10*3/uL (ref 0.00–0.07)
Basophils Absolute: 0.1 10*3/uL (ref 0.0–0.1)
Basophils Relative: 1 %
Eosinophils Absolute: 0.1 10*3/uL (ref 0.0–0.5)
Eosinophils Relative: 1 %
HCT: 43.6 % (ref 39.0–52.0)
Hemoglobin: 15.8 g/dL (ref 13.0–17.0)
Immature Granulocytes: 0 %
Lymphocytes Relative: 18 %
Lymphs Abs: 1.7 10*3/uL (ref 0.7–4.0)
MCH: 32.8 pg (ref 26.0–34.0)
MCHC: 36.2 g/dL — ABNORMAL HIGH (ref 30.0–36.0)
MCV: 90.5 fL (ref 80.0–100.0)
Monocytes Absolute: 1 10*3/uL (ref 0.1–1.0)
Monocytes Relative: 10 %
Neutro Abs: 7 10*3/uL (ref 1.7–7.7)
Neutrophils Relative %: 70 %
Platelets: 275 10*3/uL (ref 150–400)
RBC: 4.82 MIL/uL (ref 4.22–5.81)
RDW: 13.6 % (ref 11.5–15.5)
WBC: 9.9 10*3/uL (ref 4.0–10.5)
nRBC: 0 % (ref 0.0–0.2)

## 2022-08-23 LAB — ETHANOL: Alcohol, Ethyl (B): 205 mg/dL — ABNORMAL HIGH (ref <10)

## 2022-08-23 LAB — ACETAMINOPHEN LEVEL: Acetaminophen (Tylenol), Serum: <10 ug/mL — ABNORMAL LOW (ref 10–30)

## 2022-08-23 LAB — SALICYLATE LEVEL: Salicylate Lvl: <7 mg/dL — ABNORMAL LOW (ref 7.0–30.0)

## 2022-08-23 NOTE — ED Triage Notes (Addendum)
Pt arrives via POV. Pt reports he believed he had a bug in his left ear. Dr. Francia Greaves at bedside, no foreign object in left ear. Pt AxOx4

## 2022-08-23 NOTE — ED Provider Notes (Signed)
[facility] Provider Note   CSN: [id number] Arrival date & time: 08/23/22  1459     History  Chief Complaint  Patient presents with   Ear Pain   Suicidal    David Gould is a 32 y.o. male.  32 year old male with prior medical history as detailed below presents for evaluation.  Patient appeared to be clinically intoxicated on initial evaluation.  Patient complains of intermittent discomfort in the left ear.  He is concerned that a bug may be in the ear.  He otherwise was without acute complaint.  However, on nurse triage evaluation, screening questions for depression and suicidality were answered in a positive manner.  When I reexamined the patient and questioned his depression and suicidality he was unable to provide a coherent plan to harm himself.  Again, he appears to be significantly intoxicated on initial evaluation.  Expressed thoughts of depression appear to be longstanding.  Additionally, his judgment is clouded by alcohol intoxication.     The history is provided by the patient and medical records.       Home Medications Prior to Admission medications   Medication Sig Start Date End Date Taking? Authorizing Provider  acetaminophen (TYLENOL) 500 MG tablet Take 1 tablet (500 mg total) by mouth every 6 (six) hours as needed. 03/12/17   Fawze, Mina A, PA-C  albuterol (PROVENTIL HFA;VENTOLIN HFA) 108 (90 Base) MCG/ACT inhaler Inhale 2 puffs into the lungs every 4 (four) hours as needed for wheezing or shortness of breath. 08/09/18   Joy, Shawn C, PA-C  benzonatate (TESSALON) 100 MG capsule Take 1 capsule (100 mg total) by mouth every 8 (eight) hours. 08/09/18   Joy, Shawn C, PA-C  cyclobenzaprine (FLEXERIL) 10 MG tablet Take 1 tablet (10 mg total) by mouth 2 (two) times daily as needed for muscle spasms. 03/12/17   Fawze, Mina A, PA-C  famotidine (PEPCID) 20 MG tablet Take 1 tablet (20 mg total) by mouth 2 (two) times daily.  09/04/19   Hayden Rasmussen, MD  fluticasone (FLONASE) 50 MCG/ACT nasal spray Place 2 sprays into both nostrils daily. 08/09/18   Joy, Shawn C, PA-C  HYDROcodone-acetaminophen (NORCO/VICODIN) 5-325 MG tablet Take 1 tablet by mouth every 6 (six) hours as needed. 02/06/19   Horton, Barbette Hair, MD  ibuprofen (ADVIL) 600 MG tablet Take 1 tablet (600 mg total) by mouth every 6 (six) hours as needed. 11/14/19   Couture, Cortni S, PA-C  methocarbamol (ROBAXIN) 500 MG tablet Take 1 tablet (500 mg total) by mouth 2 (two) times daily. 10/25/20   Suzy Bouchard, PA-C  naproxen (NAPROSYN) 500 MG tablet Take 1 tablet (500 mg total) by mouth 2 (two) times daily. 10/25/20   Suzy Bouchard, PA-C  ondansetron (ZOFRAN ODT) 4 MG disintegrating tablet Take 1 tablet (4 mg total) by mouth every 8 (eight) hours as needed for nausea or vomiting. 08/09/18   Joy, Shawn C, PA-C  oxymetazoline (AFRIN NASAL SPRAY) 0.05 % nasal spray Instill 2 to 3 sprays into each nostril twice daily for no more than 2 days (maximum dose: 2 doses/24 hours) 01/25/17   Rodell Perna A, PA-C  predniSONE (STERAPRED UNI-PAK 21 TAB) 10 MG (21) TBPK tablet Take 6 tabs (60mg ) day 1, 5 tabs (50mg ) day 2, 4 tabs (40mg ) day 3, 3 tabs (30mg ) day 4, 2 tabs (20mg ) day 5, and 1 tab (10mg ) day 6. 08/09/18   Joy, Shawn C, PA-C      Allergies  Patient has no known allergies.    Review of Systems   Review of Systems  All other systems reviewed and are negative.   Physical Exam Updated Vital Signs BP (!) 129/94   Pulse 91   Temp 98.7 F (37.1 C)   Resp 18   SpO2 97%  Physical Exam Vitals and nursing note reviewed.  Constitutional:      General: He is not in acute distress.    Appearance: Normal appearance. He is well-developed.  HENT:     Head: Normocephalic and atraumatic.     Right Ear: Tympanic membrane normal.     Left Ear: Tympanic membrane normal.     Ears:     Comments: No visible foreign body in the left ear canal.  Minimal wax without  cerumen impaction noted.  Visible portions of TM are normal bilaterally. Eyes:     Conjunctiva/sclera: Conjunctivae normal.     Pupils: Pupils are equal, round, and reactive to light.  Cardiovascular:     Rate and Rhythm: Normal rate and regular rhythm.     Heart sounds: Normal heart sounds.  Pulmonary:     Effort: Pulmonary effort is normal. No respiratory distress.     Breath sounds: Normal breath sounds.  Abdominal:     General: There is no distension.     Palpations: Abdomen is soft.     Tenderness: There is no abdominal tenderness.  Musculoskeletal:        General: No deformity. Normal range of motion.     Cervical back: Normal range of motion and neck supple.  Skin:    General: Skin is warm and dry.  Neurological:     General: No focal deficit present.     Mental Status: He is alert and oriented to person, place, and time.  Psychiatric:     Comments: Patient does respond with questions about depression and suicidality with occasional thoughts of same.  However, on initial evaluation patient is clinically intoxicated.  He is unable to provide a clear coherent plan for harming himself.     ED Results / Procedures / Treatments   Labs (all labs ordered are listed, but only abnormal results are displayed) Labs Reviewed  CBC WITH DIFFERENTIAL/PLATELET - Abnormal; Notable for the following components:      Result Value   MCHC 36.2 (*)    All other components within normal limits  ETHANOL - Abnormal; Notable for the following components:   Alcohol, Ethyl (B) 205 (*)    All other components within normal limits  COMPREHENSIVE METABOLIC PANEL - Abnormal; Notable for the following components:   CO2 21 (*)    BUN <5 (*)    AST 75 (*)    ALT 63 (*)    All other components within normal limits  ACETAMINOPHEN LEVEL - Abnormal; Notable for the following components:   Acetaminophen (Tylenol), Serum <10 (*)    All other components within normal limits  SALICYLATE LEVEL - Abnormal;  Notable for the following components:   Salicylate Lvl [id number] (*)    All other components within normal limits  RAPID URINE DRUG SCREEN, HOSP PERFORMED    EKG None  Radiology No results found.  Procedures Procedures    Medications Ordered in ED Medications - No data to display  ED Course/ Medical Decision Making/ A&P                             Medical  Decision Making Amount and/or Complexity of Data Reviewed Labs: ordered.    Medical Screen Complete  This patient presented to the ED with complaint of left ear discomfort, alcohol intoxication.  This complaint involves an extensive number of treatment options. The initial differential diagnosis includes, but is not limited to, alcohol intoxication  This presentation is: Acute, Chronic, Self-Limited, Previously Undiagnosed, Uncertain Prognosis, Complicated, Systemic Symptoms, and Threat to Life/Bodily Function  Patient initially presented with complaint of left ear discomfort.  Ear exam is without significant abnormality.  Patient is clinically intoxicated.    Patient did answer screening questions regarding depression and suicidality in a positive manner.  However, patient is clinically intoxicated.  Labs obtained reveal elevated alcohol level.  After brief period of observation the patient does seem to be more sober.  He is now denying suicidality.  He is contracting for safety.  He desires discharge.  Importance of close outpatient follow-up was stressed.  Outpatient counseling services and options provided and discussed with the patient.  Strict return precautions given and understood. Additional history obtained:  External records from outside sources obtained and reviewed including prior ED visits and prior Inpatient records.    Lab Tests:  I ordered and personally interpreted labs.  The pertinent results include: CBC, CMP, ethanol, salicylate, acetaminophen  Problem List / ED Course:  Left ear pain,  alcohol intoxication   Reevaluation:  After the interventions noted above, I reevaluated the patient and found that they have: improved  Disposition:  After consideration of the diagnostic results and the patients response to treatment, I feel that the patent would benefit from close outpatient follow-up.          Final Clinical Impression(s) / ED Diagnoses Final diagnoses:  Left ear pain  Alcoholic intoxication without complication [facility])    Rx / DC Orders ED Discharge Orders     None         Valarie Merino, MD 08/23/22 1751

## 2022-08-23 NOTE — ED Triage Notes (Signed)
While completing patient's triage, he states he has been having thoughts of suicide for sometime now. Pt states he has no plan or intent.

## 2022-08-23 NOTE — Discharge Instructions (Addendum)
Return for any problem.  If you want psychiatric counseling or other assistance the [facility] is a 24/7 option.  They are located at [address] here in [place].  Their phone number is [phone].

## 2022-12-05 ENCOUNTER — Encounter (HOSPITAL_COMMUNITY): Payer: Self-pay | Admitting: Emergency Medicine

## 2022-12-05 ENCOUNTER — Emergency Department (HOSPITAL_COMMUNITY)
Admission: EM | Admit: 2022-12-05 | Discharge: 2022-12-05 | Discharge status: Against Medical Advice | Payer: Self-pay | Attending: Emergency Medicine | Admitting: Emergency Medicine

## 2022-12-05 ENCOUNTER — Other Ambulatory Visit: Payer: Self-pay

## 2022-12-05 DIAGNOSIS — J029 Acute pharyngitis, unspecified: Secondary | ICD-10-CM | POA: Insufficient documentation

## 2022-12-05 DIAGNOSIS — Z5321 Procedure and treatment not carried out due to patient leaving prior to being seen by health care provider: Secondary | ICD-10-CM | POA: Insufficient documentation

## 2022-12-05 LAB — GROUP A STREP BY PCR: Group A Strep by PCR: NOT DETECTED

## 2022-12-05 NOTE — ED Triage Notes (Signed)
Pt to ED from home c/o sore throat since 1900 tonight.  States painful to swallow, productive cough, denies fevers.

## 2022-12-06 ENCOUNTER — Encounter (HOSPITAL_COMMUNITY): Payer: Self-pay

## 2022-12-06 ENCOUNTER — Emergency Department (HOSPITAL_COMMUNITY)
Admission: EM | Admit: 2022-12-06 | Discharge: 2022-12-06 | Disposition: A | Discharge status: Home / Self Care | Payer: Self-pay | Attending: Emergency Medicine | Admitting: Emergency Medicine

## 2022-12-06 DIAGNOSIS — J019 Acute sinusitis, unspecified: Secondary | ICD-10-CM | POA: Insufficient documentation

## 2022-12-06 DIAGNOSIS — J329 Chronic sinusitis, unspecified: Secondary | ICD-10-CM

## 2022-12-06 DIAGNOSIS — J029 Acute pharyngitis, unspecified: Secondary | ICD-10-CM | POA: Insufficient documentation

## 2022-12-06 MED ORDER — AMOXICILLIN 500 MG PO CAPS: 500.0000 mg | ORAL_CAPSULE | Freq: Three times a day (TID) | ORAL | 0 refills | Status: AC

## 2022-12-06 NOTE — ED Triage Notes (Signed)
Pt arrived via POV, c/o sore throat, sinus congestion, cough. Was seen yesterday for same. Strep was negative

## 2022-12-06 NOTE — Discharge Instructions (Addendum)
Your workup today is overall reassuring.  No concerning cause of your sore throat.  Likely have viral pharyngitis.  Given the onset of sinusitis have sent antibiotic into the pharmacy for you to keep on hand if this does not improve the next 3 to 4 days you can start taking this.  Drink plenty of fluids.  Take Tylenol and ibuprofen as needed for pain control.  For any concerning or worsening symptoms return to the emergency room

## 2022-12-06 NOTE — ED Provider Notes (Signed)
[place] EMERGENCY DEPARTMENT AT [facility] Provider Note   CSN: [id number] Arrival date & time: 12/06/22  0941     History  Chief Complaint  Patient presents with   Sore Throat    David Gould is a 32 y.o. male.  32 year old male presents today for evaluation of pharyngitis of 3 days duration.  States initially he had difficulty with p.o. intake however over the past day he has been able to drink liquids without much issue.  Does endorse congestion and postnasal drainage patwardhan due to persistent cough.  Denies any known sick contacts.  No chest pain, shortness of breath.  No drooling, or voice change.  The history is provided by the patient. No language interpreter was used.       Home Medications Prior to Admission medications   Medication Sig Start Date End Date Taking? Authorizing Provider  acetaminophen (TYLENOL) 500 MG tablet Take 1 tablet (500 mg total) by mouth every 6 (six) hours as needed. 03/12/17   Fawze, Mina A, PA-C  albuterol (PROVENTIL HFA;VENTOLIN HFA) 108 (90 Base) MCG/ACT inhaler Inhale 2 puffs into the lungs every 4 (four) hours as needed for wheezing or shortness of breath. 08/09/18   Joy, Shawn C, PA-C  benzonatate (TESSALON) 100 MG capsule Take 1 capsule (100 mg total) by mouth every 8 (eight) hours. 08/09/18   Joy, Shawn C, PA-C  cyclobenzaprine (FLEXERIL) 10 MG tablet Take 1 tablet (10 mg total) by mouth 2 (two) times daily as needed for muscle spasms. 03/12/17   Fawze, Mina A, PA-C  famotidine (PEPCID) 20 MG tablet Take 1 tablet (20 mg total) by mouth 2 (two) times daily. 09/04/19   Terrilee Files, MD  fluticasone (FLONASE) 50 MCG/ACT nasal spray Place 2 sprays into both nostrils daily. 08/09/18   Joy, Shawn C, PA-C  HYDROcodone-acetaminophen (NORCO/VICODIN) 5-325 MG tablet Take 1 tablet by mouth every 6 (six) hours as needed. 02/06/19   Horton, Mayer Masker, MD  ibuprofen (ADVIL) 600 MG tablet Take 1 tablet (600 mg total) by mouth every 6 (six)  hours as needed. 11/14/19   Couture, Cortni S, PA-C  methocarbamol (ROBAXIN) 500 MG tablet Take 1 tablet (500 mg total) by mouth 2 (two) times daily. 10/25/20   Mannie Stabile, PA-C  naproxen (NAPROSYN) 500 MG tablet Take 1 tablet (500 mg total) by mouth 2 (two) times daily. 10/25/20   Mannie Stabile, PA-C  ondansetron (ZOFRAN ODT) 4 MG disintegrating tablet Take 1 tablet (4 mg total) by mouth every 8 (eight) hours as needed for nausea or vomiting. 08/09/18   Joy, Shawn C, PA-C  oxymetazoline (AFRIN NASAL SPRAY) 0.05 % nasal spray Instill 2 to 3 sprays into each nostril twice daily for no more than 2 days (maximum dose: 2 doses/24 hours) 01/25/17   Michela Pitcher A, PA-C  predniSONE (STERAPRED UNI-PAK 21 TAB) 10 MG (21) TBPK tablet Take 6 tabs (60mg ) day 1, 5 tabs (50mg ) day 2, 4 tabs (40mg ) day 3, 3 tabs (30mg ) day 4, 2 tabs (20mg ) day 5, and 1 tab (10mg ) day 6. 08/09/18   Joy, Shawn C, PA-C      Allergies    Patient has no known allergies.    Review of Systems   Review of Systems  Constitutional:  Negative for chills and fever.  HENT:  Positive for congestion, postnasal drip and sore throat. Negative for trouble swallowing and voice change.   Respiratory:  Positive for cough. Negative for shortness of breath.  Cardiovascular:  Negative for chest pain.  All other systems reviewed and are negative.   Physical Exam Updated Vital Signs BP (!) 142/107 (BP Location: Left Arm)   Pulse 78   Temp 98.4 F (36.9 C) (Oral)   Resp 19  Physical Exam Vitals and nursing note reviewed.  Constitutional:      General: He is not in acute distress.    Appearance: Normal appearance. He is not ill-appearing.  HENT:     Head: Normocephalic and atraumatic.     Nose: Nose normal.     Mouth/Throat:     Mouth: Mucous membranes are moist.     Pharynx: No oropharyngeal exudate or posterior oropharyngeal erythema.  Eyes:     General: No scleral icterus.    Extraocular Movements: Extraocular movements  intact.     Conjunctiva/sclera: Conjunctivae normal.  Cardiovascular:     Rate and Rhythm: Normal rate and regular rhythm.     Heart sounds: Normal heart sounds.  Pulmonary:     Effort: Pulmonary effort is normal. No respiratory distress.     Breath sounds: Normal breath sounds. No wheezing or rales.  Abdominal:     General: There is no distension.     Palpations: Abdomen is soft.     Tenderness: There is no abdominal tenderness.  Musculoskeletal:        General: Normal range of motion.     Cervical back: Normal range of motion.  Skin:    General: Skin is warm and dry.  Neurological:     General: No focal deficit present.     Mental Status: He is alert. Mental status is at baseline.     ED Results / Procedures / Treatments   Labs (all labs ordered are listed, but only abnormal results are displayed) Labs Reviewed - No data to display  EKG None  Radiology No results found.  Procedures Procedures    Medications Ordered in ED Medications - No data to display  ED Course/ Medical Decision Making/ A&P                             Medical Decision Making  32 year old male presents today for evaluation of pharyngitis.  Also endorses congestion, postnasal drainage.  On exam there is no evidence of retropharyngeal abscess, Ludwig's angina, peritonsillar abscess, or trismus.  No exudates.  Afebrile.  Symptomatic management discussed.  Will give amoxicillin to keep on hand in case he does not get better to start taking for sinusitis.  Sinus rinse discussed.  Patient voices understanding and is in agreement with plan.  We did discuss imaging however given its improvement and is able to tolerate p.o. intake patient agreed to defer this today.   Final Clinical Impression(s) / ED Diagnoses Final diagnoses:  Pharyngitis, unspecified etiology  Sinusitis, unspecified chronicity, unspecified location    Rx / DC Orders ED Discharge Orders          Ordered    amoxicillin (AMOXIL)  500 MG capsule  3 times daily        12/06/22 1025              Marita [place], [place] 12/06/22 1025    Terald Sleeper, MD 12/06/22 1229
